# Patient Record
Sex: Male | Born: 1985 | Race: White | Hispanic: No | Marital: Single | Smoking: Current every day smoker
Health system: Southern US, Community
[De-identification: ages and names within clinical notes are randomized; demographics above are authoritative.]

## PROBLEM LIST (undated history)

## (undated) DIAGNOSIS — K859 Acute pancreatitis without necrosis or infection, unspecified: Secondary | ICD-10-CM

## (undated) DIAGNOSIS — M549 Dorsalgia, unspecified: Secondary | ICD-10-CM

## (undated) HISTORY — PX: TONSILLECTOMY: SUR1361

---

## 2004-10-31 ENCOUNTER — Emergency Department: Payer: Self-pay | Admitting: Emergency Medicine

## 2009-10-05 ENCOUNTER — Emergency Department: Payer: Self-pay | Admitting: Emergency Medicine

## 2009-12-25 ENCOUNTER — Emergency Department: Payer: Self-pay | Admitting: Emergency Medicine

## 2012-01-23 ENCOUNTER — Emergency Department: Payer: Self-pay | Admitting: Emergency Medicine

## 2012-03-20 ENCOUNTER — Emergency Department: Payer: Self-pay | Admitting: Emergency Medicine

## 2012-10-20 ENCOUNTER — Emergency Department: Payer: Self-pay | Admitting: Emergency Medicine

## 2012-10-21 ENCOUNTER — Emergency Department: Payer: Self-pay | Admitting: Emergency Medicine

## 2012-10-21 LAB — URINALYSIS, COMPLETE
Bacteria: NONE SEEN
Blood: NEGATIVE
Glucose,UR: NEGATIVE mg/dL (ref 0–75)
Leukocyte Esterase: NEGATIVE
Nitrite: NEGATIVE
Protein: NEGATIVE
Specific Gravity: 1.002 (ref 1.003–1.030)
Squamous Epithelial: <1
WBC UR: <1 /HPF (ref 0–5)

## 2012-12-09 ENCOUNTER — Emergency Department: Payer: Self-pay | Admitting: Emergency Medicine

## 2012-12-17 ENCOUNTER — Emergency Department: Payer: Self-pay | Admitting: Internal Medicine

## 2012-12-17 LAB — URINALYSIS, COMPLETE
Bacteria: NONE SEEN
Ketone: NEGATIVE
Nitrite: NEGATIVE
Ph: 5 (ref 4.5–8.0)
Protein: NEGATIVE
Specific Gravity: 1.02 (ref 1.003–1.030)
Squamous Epithelial: NONE SEEN

## 2013-02-25 ENCOUNTER — Emergency Department: Payer: Self-pay | Admitting: Emergency Medicine

## 2013-02-25 LAB — COMPREHENSIVE METABOLIC PANEL
Albumin: 4.1 g/dL (ref 3.4–5.0)
Alkaline Phosphatase: 95 U/L (ref 50–136)
Bilirubin,Total: 0.5 mg/dL (ref 0.2–1.0)
Chloride: 107 mmol/L (ref 98–107)
Co2: 26 mmol/L (ref 21–32)
Creatinine: 0.89 mg/dL (ref 0.60–1.30)
EGFR (African American): >60
EGFR (Non-African Amer.): >60
Glucose: 89 mg/dL (ref 65–99)
Osmolality: 273 (ref 275–301)
Potassium: 3.9 mmol/L (ref 3.5–5.1)
SGPT (ALT): 35 U/L (ref 12–78)
Total Protein: 7.2 g/dL (ref 6.4–8.2)

## 2013-02-25 LAB — CBC
MCH: 31.2 pg (ref 26.0–34.0)
MCHC: 33.9 g/dL (ref 32.0–36.0)
MCV: 92 fL (ref 80–100)
Platelet: 229 10*3/uL (ref 150–440)
RDW: 12.9 % (ref 11.5–14.5)
WBC: 13.2 10*3/uL — ABNORMAL HIGH (ref 3.8–10.6)

## 2013-02-25 LAB — TROPONIN I: Troponin-I: <0.02 ng/mL

## 2013-02-25 LAB — LIPASE, BLOOD: Lipase: 1209 U/L — ABNORMAL HIGH (ref 73–393)

## 2013-03-02 ENCOUNTER — Emergency Department: Payer: Self-pay | Admitting: Emergency Medicine

## 2013-03-02 LAB — LIPASE, BLOOD: Lipase: 352 U/L (ref 73–393)

## 2013-03-02 LAB — COMPREHENSIVE METABOLIC PANEL
Alkaline Phosphatase: 86 U/L (ref 50–136)
Bilirubin,Total: 0.5 mg/dL (ref 0.2–1.0)
Calcium, Total: 9.2 mg/dL (ref 8.5–10.1)
Creatinine: 1.04 mg/dL (ref 0.60–1.30)
EGFR (Non-African Amer.): >60
Glucose: 113 mg/dL — ABNORMAL HIGH (ref 65–99)
SGOT(AST): 16 U/L (ref 15–37)
SGPT (ALT): 26 U/L (ref 12–78)
Total Protein: 7.4 g/dL (ref 6.4–8.2)

## 2013-03-02 LAB — CBC
MCH: 31.6 pg (ref 26.0–34.0)
MCV: 92 fL (ref 80–100)
RBC: 5.12 10*6/uL (ref 4.40–5.90)
WBC: 9.4 10*3/uL (ref 3.8–10.6)

## 2013-03-02 LAB — URINALYSIS, COMPLETE
Glucose,UR: NEGATIVE mg/dL (ref 0–75)
Ketone: NEGATIVE
Leukocyte Esterase: NEGATIVE
Ph: 5 (ref 4.5–8.0)
Protein: NEGATIVE
RBC,UR: 1 /HPF (ref 0–5)

## 2013-04-10 ENCOUNTER — Emergency Department: Payer: Self-pay | Admitting: Emergency Medicine

## 2013-04-11 ENCOUNTER — Emergency Department: Payer: Self-pay | Admitting: Emergency Medicine

## 2013-06-03 ENCOUNTER — Emergency Department: Payer: Self-pay | Admitting: Emergency Medicine

## 2013-06-05 ENCOUNTER — Emergency Department: Payer: Self-pay | Admitting: Emergency Medicine

## 2013-06-05 LAB — URINALYSIS, COMPLETE
Bilirubin,UR: NEGATIVE
Blood: NEGATIVE
Glucose,UR: NEGATIVE mg/dL (ref 0–75)
Ketone: NEGATIVE
Leukocyte Esterase: NEGATIVE
Nitrite: NEGATIVE
Ph: 5 (ref 4.5–8.0)
Protein: NEGATIVE
RBC,UR: <1 /HPF (ref 0–5)
WBC UR: <1 /HPF (ref 0–5)

## 2013-06-10 ENCOUNTER — Emergency Department: Payer: Self-pay | Admitting: Emergency Medicine

## 2013-06-10 LAB — CBC WITH DIFFERENTIAL/PLATELET
Basophil %: 1 %
Eosinophil %: 3.8 %
HCT: 45.4 % (ref 40.0–52.0)
HGB: 15.9 g/dL (ref 13.0–18.0)
MCH: 32.3 pg (ref 26.0–34.0)
MCHC: 35 g/dL (ref 32.0–36.0)
MCV: 92 fL (ref 80–100)
Monocyte %: 4.2 %
Platelet: 220 10*3/uL (ref 150–440)
RBC: 4.92 10*6/uL (ref 4.40–5.90)
RDW: 12.6 % (ref 11.5–14.5)
WBC: 14 10*3/uL — ABNORMAL HIGH (ref 3.8–10.6)

## 2013-06-10 LAB — COMPREHENSIVE METABOLIC PANEL
Albumin: 4.1 g/dL (ref 3.4–5.0)
Alkaline Phosphatase: 93 U/L (ref 50–136)
Anion Gap: 6 — ABNORMAL LOW (ref 7–16)
Calcium, Total: 9.2 mg/dL (ref 8.5–10.1)
Chloride: 108 mmol/L — ABNORMAL HIGH (ref 98–107)
Creatinine: 1.02 mg/dL (ref 0.60–1.30)
EGFR (African American): >60
Glucose: 84 mg/dL (ref 65–99)
Osmolality: 275 (ref 275–301)
Potassium: 3.9 mmol/L (ref 3.5–5.1)
SGOT(AST): 33 U/L (ref 15–37)
SGPT (ALT): 61 U/L (ref 12–78)
Sodium: 139 mmol/L (ref 136–145)

## 2013-06-10 LAB — TROPONIN I: Troponin-I: <0.02 ng/mL

## 2013-06-30 ENCOUNTER — Emergency Department: Payer: Self-pay | Admitting: Emergency Medicine

## 2013-09-10 ENCOUNTER — Emergency Department: Payer: Self-pay | Admitting: Emergency Medicine

## 2013-09-10 LAB — CBC WITH DIFFERENTIAL/PLATELET
Basophil #: 0.1 10*3/uL (ref 0.0–0.1)
Basophil %: 0.9 %
Eosinophil #: 0.5 10*3/uL (ref 0.0–0.7)
Eosinophil %: 4.5 %
HCT: 47.2 % (ref 40.0–52.0)
HGB: 16.1 g/dL (ref 13.0–18.0)
LYMPHS PCT: 23 %
Lymphocyte #: 2.4 10*3/uL (ref 1.0–3.6)
MCH: 32.2 pg (ref 26.0–34.0)
MCHC: 34 g/dL (ref 32.0–36.0)
MCV: 95 fL (ref 80–100)
Monocyte #: 0.6 x10 3/mm (ref 0.2–1.0)
Monocyte %: 5.6 %
NEUTROS ABS: 6.8 10*3/uL — AB (ref 1.4–6.5)
Neutrophil %: 66 %
Platelet: 208 10*3/uL (ref 150–440)
RBC: 4.99 10*6/uL (ref 4.40–5.90)
RDW: 12.6 % (ref 11.5–14.5)
WBC: 10.3 10*3/uL (ref 3.8–10.6)

## 2013-09-10 LAB — URINALYSIS, COMPLETE
Bacteria: NONE SEEN
Bilirubin,UR: NEGATIVE
Blood: NEGATIVE
Glucose,UR: NEGATIVE mg/dL (ref 0–75)
Ketone: NEGATIVE
Leukocyte Esterase: NEGATIVE
NITRITE: NEGATIVE
PROTEIN: NEGATIVE
Ph: 5 (ref 4.5–8.0)
Specific Gravity: 1.012 (ref 1.003–1.030)
Squamous Epithelial: NONE SEEN

## 2013-09-10 LAB — COMPREHENSIVE METABOLIC PANEL
ALBUMIN: 4.1 g/dL (ref 3.4–5.0)
Alkaline Phosphatase: 102 U/L
Anion Gap: 3 — ABNORMAL LOW (ref 7–16)
BILIRUBIN TOTAL: 0.4 mg/dL (ref 0.2–1.0)
BUN: 9 mg/dL (ref 7–18)
CALCIUM: 9.1 mg/dL (ref 8.5–10.1)
CREATININE: 0.88 mg/dL (ref 0.60–1.30)
Chloride: 106 mmol/L (ref 98–107)
Co2: 26 mmol/L (ref 21–32)
EGFR (Non-African Amer.): >60
GLUCOSE: 94 mg/dL (ref 65–99)
OSMOLALITY: 269 (ref 275–301)
Potassium: 4 mmol/L (ref 3.5–5.1)
SGOT(AST): 25 U/L (ref 15–37)
SGPT (ALT): 71 U/L (ref 12–78)
SODIUM: 135 mmol/L — AB (ref 136–145)
Total Protein: 7.4 g/dL (ref 6.4–8.2)

## 2013-09-10 LAB — LIPASE, BLOOD: LIPASE: 90 U/L (ref 73–393)

## 2013-10-09 ENCOUNTER — Emergency Department: Payer: Self-pay | Admitting: Emergency Medicine

## 2013-10-09 LAB — URINALYSIS, COMPLETE
Bacteria: NONE SEEN
Bilirubin,UR: NEGATIVE
Blood: NEGATIVE
GLUCOSE, UR: NEGATIVE mg/dL (ref 0–75)
KETONE: NEGATIVE
Leukocyte Esterase: NEGATIVE
Nitrite: NEGATIVE
PH: 6 (ref 4.5–8.0)
PROTEIN: NEGATIVE
RBC, UR: NONE SEEN /HPF (ref 0–5)
Specific Gravity: 1.012 (ref 1.003–1.030)
Squamous Epithelial: NONE SEEN

## 2013-10-09 LAB — COMPREHENSIVE METABOLIC PANEL
ALK PHOS: 84 U/L
ALT: 21 U/L (ref 12–78)
Albumin: 4.2 g/dL (ref 3.4–5.0)
Anion Gap: 5 — ABNORMAL LOW (ref 7–16)
BUN: 9 mg/dL (ref 7–18)
Bilirubin,Total: 0.5 mg/dL (ref 0.2–1.0)
CHLORIDE: 106 mmol/L (ref 98–107)
CO2: 26 mmol/L (ref 21–32)
Calcium, Total: 8.9 mg/dL (ref 8.5–10.1)
Creatinine: 0.81 mg/dL (ref 0.60–1.30)
EGFR (African American): >60
EGFR (Non-African Amer.): >60
GLUCOSE: 95 mg/dL (ref 65–99)
OSMOLALITY: 272 (ref 275–301)
Potassium: 4 mmol/L (ref 3.5–5.1)
SGOT(AST): 19 U/L (ref 15–37)
SODIUM: 137 mmol/L (ref 136–145)
Total Protein: 7.7 g/dL (ref 6.4–8.2)

## 2013-10-09 LAB — CBC WITH DIFFERENTIAL/PLATELET
BASOS PCT: 0.9 %
Basophil #: 0.1 10*3/uL (ref 0.0–0.1)
Eosinophil #: 0.6 10*3/uL (ref 0.0–0.7)
Eosinophil %: 5.6 %
HCT: 46.5 % (ref 40.0–52.0)
HGB: 16.1 g/dL (ref 13.0–18.0)
LYMPHS PCT: 26.3 %
Lymphocyte #: 2.7 10*3/uL (ref 1.0–3.6)
MCH: 32.6 pg (ref 26.0–34.0)
MCHC: 34.6 g/dL (ref 32.0–36.0)
MCV: 94 fL (ref 80–100)
Monocyte #: 0.6 x10 3/mm (ref 0.2–1.0)
Monocyte %: 5.7 %
NEUTROS ABS: 6.3 10*3/uL (ref 1.4–6.5)
NEUTROS PCT: 61.5 %
Platelet: 208 10*3/uL (ref 150–440)
RBC: 4.94 10*6/uL (ref 4.40–5.90)
RDW: 12.6 % (ref 11.5–14.5)
WBC: 10.2 10*3/uL (ref 3.8–10.6)

## 2013-10-09 LAB — LIPASE, BLOOD: Lipase: 307 U/L (ref 73–393)

## 2013-11-07 ENCOUNTER — Emergency Department: Payer: Self-pay | Admitting: Emergency Medicine

## 2013-11-11 ENCOUNTER — Emergency Department: Payer: Self-pay | Admitting: Internal Medicine

## 2014-01-17 ENCOUNTER — Emergency Department: Payer: Self-pay | Admitting: Emergency Medicine

## 2014-02-25 ENCOUNTER — Emergency Department: Payer: Self-pay | Admitting: Emergency Medicine

## 2014-04-01 ENCOUNTER — Emergency Department: Payer: Self-pay | Admitting: Emergency Medicine

## 2014-04-01 LAB — URINALYSIS, COMPLETE
Bilirubin,UR: NEGATIVE
Blood: NEGATIVE
Glucose,UR: NEGATIVE mg/dL (ref 0–75)
KETONE: NEGATIVE
Leukocyte Esterase: NEGATIVE
Nitrite: NEGATIVE
PH: 5 (ref 4.5–8.0)
PROTEIN: NEGATIVE
RBC,UR: 2 /HPF (ref 0–5)
SPECIFIC GRAVITY: 1.031 (ref 1.003–1.030)
Squamous Epithelial: NONE SEEN
WBC UR: <1 /HPF (ref 0–5)

## 2014-04-12 ENCOUNTER — Emergency Department: Payer: Self-pay | Admitting: Emergency Medicine

## 2014-04-12 LAB — COMPREHENSIVE METABOLIC PANEL
ALK PHOS: 87 U/L
AST: 11 U/L — AB (ref 15–37)
Albumin: 4 g/dL (ref 3.4–5.0)
Anion Gap: 6 — ABNORMAL LOW (ref 7–16)
BUN: 11 mg/dL (ref 7–18)
Bilirubin,Total: 0.4 mg/dL (ref 0.2–1.0)
CALCIUM: 9.1 mg/dL (ref 8.5–10.1)
CREATININE: 0.98 mg/dL (ref 0.60–1.30)
Chloride: 107 mmol/L (ref 98–107)
Co2: 27 mmol/L (ref 21–32)
EGFR (African American): >60
GLUCOSE: 96 mg/dL (ref 65–99)
OSMOLALITY: 279 (ref 275–301)
Potassium: 4.4 mmol/L (ref 3.5–5.1)
SGPT (ALT): 23 U/L
SODIUM: 140 mmol/L (ref 136–145)
Total Protein: 7.1 g/dL (ref 6.4–8.2)

## 2014-04-12 LAB — CBC WITH DIFFERENTIAL/PLATELET
BASOS PCT: 1.2 %
Basophil #: 0.1 10*3/uL (ref 0.0–0.1)
EOS ABS: 0.4 10*3/uL (ref 0.0–0.7)
EOS PCT: 4.5 %
HCT: 46.3 % (ref 40.0–52.0)
HGB: 15.6 g/dL (ref 13.0–18.0)
Lymphocyte #: 2.5 10*3/uL (ref 1.0–3.6)
Lymphocyte %: 28.1 %
MCH: 32 pg (ref 26.0–34.0)
MCHC: 33.7 g/dL (ref 32.0–36.0)
MCV: 95 fL (ref 80–100)
Monocyte #: 0.6 x10 3/mm (ref 0.2–1.0)
Monocyte %: 6.2 %
NEUTROS PCT: 60 %
Neutrophil #: 5.4 10*3/uL (ref 1.4–6.5)
PLATELETS: 221 10*3/uL (ref 150–440)
RBC: 4.86 10*6/uL (ref 4.40–5.90)
RDW: 12.8 % (ref 11.5–14.5)
WBC: 9 10*3/uL (ref 3.8–10.6)

## 2014-04-12 LAB — URINALYSIS, COMPLETE
BACTERIA: NONE SEEN
Bilirubin,UR: NEGATIVE
Blood: NEGATIVE
GLUCOSE, UR: NEGATIVE mg/dL (ref 0–75)
KETONE: NEGATIVE
LEUKOCYTE ESTERASE: NEGATIVE
Nitrite: NEGATIVE
Ph: 5 (ref 4.5–8.0)
Protein: NEGATIVE
RBC,UR: <1 /HPF (ref 0–5)
SPECIFIC GRAVITY: 1.028 (ref 1.003–1.030)
WBC UR: 1 /HPF (ref 0–5)

## 2014-05-16 ENCOUNTER — Emergency Department: Payer: Self-pay | Admitting: Student

## 2014-05-16 LAB — COMPREHENSIVE METABOLIC PANEL
AST: 23 U/L (ref 15–37)
Albumin: 3.8 g/dL (ref 3.4–5.0)
Alkaline Phosphatase: 81 U/L
Anion Gap: 6 — ABNORMAL LOW (ref 7–16)
BUN: 11 mg/dL (ref 7–18)
Bilirubin,Total: 0.3 mg/dL (ref 0.2–1.0)
CALCIUM: 8.6 mg/dL (ref 8.5–10.1)
CHLORIDE: 108 mmol/L — AB (ref 98–107)
Co2: 26 mmol/L (ref 21–32)
Creatinine: 0.91 mg/dL (ref 0.60–1.30)
EGFR (Non-African Amer.): >60
Glucose: 106 mg/dL — ABNORMAL HIGH (ref 65–99)
Osmolality: 279 (ref 275–301)
Potassium: 3.8 mmol/L (ref 3.5–5.1)
SGPT (ALT): 30 U/L
SODIUM: 140 mmol/L (ref 136–145)
Total Protein: 7.3 g/dL (ref 6.4–8.2)

## 2014-05-16 LAB — URINALYSIS, COMPLETE
Bacteria: NONE SEEN
Bilirubin,UR: NEGATIVE
GLUCOSE, UR: NEGATIVE mg/dL (ref 0–75)
Ketone: NEGATIVE
Leukocyte Esterase: NEGATIVE
NITRITE: NEGATIVE
Ph: 5 (ref 4.5–8.0)
Protein: NEGATIVE
Specific Gravity: 1.014 (ref 1.003–1.030)
Squamous Epithelial: <1
WBC UR: <1 /HPF (ref 0–5)

## 2014-05-16 LAB — CBC
HCT: 45.2 % (ref 40.0–52.0)
HGB: 14.6 g/dL (ref 13.0–18.0)
MCH: 30.8 pg (ref 26.0–34.0)
MCHC: 32.4 g/dL (ref 32.0–36.0)
MCV: 95 fL (ref 80–100)
PLATELETS: 264 10*3/uL (ref 150–440)
RBC: 4.75 10*6/uL (ref 4.40–5.90)
RDW: 12.6 % (ref 11.5–14.5)
WBC: 9.5 10*3/uL (ref 3.8–10.6)

## 2014-05-16 LAB — LIPASE, BLOOD: Lipase: 941 U/L — ABNORMAL HIGH (ref 73–393)

## 2014-05-19 ENCOUNTER — Emergency Department: Payer: Self-pay | Admitting: Emergency Medicine

## 2014-05-19 LAB — CBC
HCT: 43.5 % (ref 40.0–52.0)
HGB: 14.7 g/dL (ref 13.0–18.0)
MCH: 31.8 pg (ref 26.0–34.0)
MCHC: 33.9 g/dL (ref 32.0–36.0)
MCV: 94 fL (ref 80–100)
PLATELETS: 266 10*3/uL (ref 150–440)
RBC: 4.64 10*6/uL (ref 4.40–5.90)
RDW: 12.4 % (ref 11.5–14.5)
WBC: 11.1 10*3/uL — ABNORMAL HIGH (ref 3.8–10.6)

## 2014-05-19 LAB — COMPREHENSIVE METABOLIC PANEL
ALK PHOS: 80 U/L
ANION GAP: 5 — AB (ref 7–16)
AST: 25 U/L (ref 15–37)
Albumin: 3.8 g/dL (ref 3.4–5.0)
BUN: 12 mg/dL (ref 7–18)
Bilirubin,Total: 0.5 mg/dL (ref 0.2–1.0)
CALCIUM: 8.9 mg/dL (ref 8.5–10.1)
CO2: 28 mmol/L (ref 21–32)
CREATININE: 1 mg/dL (ref 0.60–1.30)
Chloride: 108 mmol/L — ABNORMAL HIGH (ref 98–107)
EGFR (African American): >60
Glucose: 78 mg/dL (ref 65–99)
OSMOLALITY: 280 (ref 275–301)
Potassium: 3.9 mmol/L (ref 3.5–5.1)
SGPT (ALT): 34 U/L
SODIUM: 141 mmol/L (ref 136–145)
TOTAL PROTEIN: 7 g/dL (ref 6.4–8.2)

## 2014-05-19 LAB — LIPASE, BLOOD: Lipase: 190 U/L (ref 73–393)

## 2014-05-19 LAB — ETHANOL

## 2014-06-02 ENCOUNTER — Emergency Department: Payer: Self-pay | Admitting: Emergency Medicine

## 2014-06-07 ENCOUNTER — Emergency Department: Payer: Self-pay | Admitting: Emergency Medicine

## 2014-06-20 ENCOUNTER — Emergency Department: Payer: Self-pay | Admitting: Emergency Medicine

## 2014-06-20 LAB — URINALYSIS, COMPLETE
BACTERIA: NONE SEEN
Bilirubin,UR: NEGATIVE
Blood: NEGATIVE
Glucose,UR: NEGATIVE mg/dL (ref 0–75)
KETONE: NEGATIVE
LEUKOCYTE ESTERASE: NEGATIVE
Nitrite: NEGATIVE
PROTEIN: NEGATIVE
Ph: 5 (ref 4.5–8.0)
RBC, UR: NONE SEEN /HPF (ref 0–5)
Specific Gravity: 1.025 (ref 1.003–1.030)
Squamous Epithelial: NONE SEEN
WBC UR: NONE SEEN /HPF (ref 0–5)

## 2014-06-20 LAB — COMPREHENSIVE METABOLIC PANEL
ALT: 70 U/L — AB
AST: 30 U/L (ref 15–37)
Albumin: 3.9 g/dL (ref 3.4–5.0)
Alkaline Phosphatase: 97 U/L
Anion Gap: 6 — ABNORMAL LOW (ref 7–16)
BUN: 12 mg/dL (ref 7–18)
Bilirubin,Total: 0.4 mg/dL (ref 0.2–1.0)
CALCIUM: 8.6 mg/dL (ref 8.5–10.1)
Chloride: 108 mmol/L — ABNORMAL HIGH (ref 98–107)
Co2: 27 mmol/L (ref 21–32)
Creatinine: 1.05 mg/dL (ref 0.60–1.30)
GLUCOSE: 102 mg/dL — AB (ref 65–99)
OSMOLALITY: 281 (ref 275–301)
POTASSIUM: 3.7 mmol/L (ref 3.5–5.1)
Sodium: 141 mmol/L (ref 136–145)
Total Protein: 6.8 g/dL (ref 6.4–8.2)

## 2014-06-20 LAB — CBC WITH DIFFERENTIAL/PLATELET
Basophil #: 0.1 10*3/uL (ref 0.0–0.1)
Basophil %: 1 %
EOS ABS: 0.6 10*3/uL (ref 0.0–0.7)
Eosinophil %: 5.4 %
HCT: 46.5 % (ref 40.0–52.0)
HGB: 15.6 g/dL (ref 13.0–18.0)
LYMPHS ABS: 3 10*3/uL (ref 1.0–3.6)
Lymphocyte %: 27.9 %
MCH: 31.9 pg (ref 26.0–34.0)
MCHC: 33.5 g/dL (ref 32.0–36.0)
MCV: 95 fL (ref 80–100)
MONOS PCT: 5.4 %
Monocyte #: 0.6 x10 3/mm (ref 0.2–1.0)
NEUTROS PCT: 60.3 %
Neutrophil #: 6.5 10*3/uL (ref 1.4–6.5)
Platelet: 234 10*3/uL (ref 150–440)
RBC: 4.88 10*6/uL (ref 4.40–5.90)
RDW: 12.5 % (ref 11.5–14.5)
WBC: 10.8 10*3/uL — ABNORMAL HIGH (ref 3.8–10.6)

## 2014-06-20 LAB — LIPASE, BLOOD: LIPASE: 863 U/L — AB (ref 73–393)

## 2014-06-20 LAB — TROPONIN I: Troponin-I: <0.02 ng/mL

## 2014-06-21 ENCOUNTER — Inpatient Hospital Stay: Payer: Self-pay | Admitting: Internal Medicine

## 2014-06-21 LAB — CBC WITH DIFFERENTIAL/PLATELET
BASOS ABS: 0.1 10*3/uL (ref 0.0–0.1)
BASOS PCT: 1 %
EOS ABS: 0.3 10*3/uL (ref 0.0–0.7)
Eosinophil %: 3.3 %
HCT: 45.8 % (ref 40.0–52.0)
HGB: 15.2 g/dL (ref 13.0–18.0)
Lymphocyte #: 3 10*3/uL (ref 1.0–3.6)
Lymphocyte %: 30.5 %
MCH: 32 pg (ref 26.0–34.0)
MCHC: 33.3 g/dL (ref 32.0–36.0)
MCV: 96 fL (ref 80–100)
Monocyte #: 0.6 x10 3/mm (ref 0.2–1.0)
Monocyte %: 6.3 %
NEUTROS PCT: 58.9 %
Neutrophil #: 5.8 10*3/uL (ref 1.4–6.5)
Platelet: 221 10*3/uL (ref 150–440)
RBC: 4.76 10*6/uL (ref 4.40–5.90)
RDW: 12.5 % (ref 11.5–14.5)
WBC: 9.8 10*3/uL (ref 3.8–10.6)

## 2014-06-21 LAB — URINALYSIS, COMPLETE
Bacteria: NONE SEEN
Bilirubin,UR: NEGATIVE
Blood: NEGATIVE
Glucose,UR: NEGATIVE mg/dL (ref 0–75)
Ketone: NEGATIVE
LEUKOCYTE ESTERASE: NEGATIVE
NITRITE: NEGATIVE
PH: 5 (ref 4.5–8.0)
Protein: NEGATIVE
SPECIFIC GRAVITY: 1.028 (ref 1.003–1.030)
Squamous Epithelial: NONE SEEN

## 2014-06-21 LAB — LIPID PANEL
CHOLESTEROL: 158 mg/dL (ref 0–200)
HDL: 44 mg/dL (ref 40–60)
LDL CHOLESTEROL, CALC: 86 mg/dL (ref 0–100)
Triglycerides: 139 mg/dL (ref 0–200)
VLDL CHOLESTEROL, CALC: 28 mg/dL (ref 5–40)

## 2014-06-21 LAB — COMPREHENSIVE METABOLIC PANEL
ALT: 60 U/L
Albumin: 4 g/dL (ref 3.4–5.0)
Alkaline Phosphatase: 91 U/L
Anion Gap: 8 (ref 7–16)
BUN: 12 mg/dL (ref 7–18)
Bilirubin,Total: 0.5 mg/dL (ref 0.2–1.0)
CALCIUM: 8.8 mg/dL (ref 8.5–10.1)
CO2: 27 mmol/L (ref 21–32)
CREATININE: 1.06 mg/dL (ref 0.60–1.30)
Chloride: 107 mmol/L (ref 98–107)
GLUCOSE: 106 mg/dL — AB (ref 65–99)
OSMOLALITY: 283 (ref 275–301)
Potassium: 4.7 mmol/L (ref 3.5–5.1)
SGOT(AST): 19 U/L (ref 15–37)
Sodium: 142 mmol/L (ref 136–145)
Total Protein: 7.1 g/dL (ref 6.4–8.2)

## 2014-06-21 LAB — LIPASE, BLOOD: Lipase: 1142 U/L — ABNORMAL HIGH (ref 73–393)

## 2014-06-22 LAB — COMPREHENSIVE METABOLIC PANEL
ALBUMIN: 3.1 g/dL — AB (ref 3.4–5.0)
ANION GAP: 4 — AB (ref 7–16)
AST: 24 U/L (ref 15–37)
Alkaline Phosphatase: 72 U/L
BUN: 12 mg/dL (ref 7–18)
Bilirubin,Total: 0.5 mg/dL (ref 0.2–1.0)
CALCIUM: 7.9 mg/dL — AB (ref 8.5–10.1)
CREATININE: 1.01 mg/dL (ref 0.60–1.30)
Chloride: 110 mmol/L — ABNORMAL HIGH (ref 98–107)
Co2: 26 mmol/L (ref 21–32)
Glucose: 91 mg/dL (ref 65–99)
Osmolality: 279 (ref 275–301)
POTASSIUM: 3.8 mmol/L (ref 3.5–5.1)
SGPT (ALT): 53 U/L
Sodium: 140 mmol/L (ref 136–145)
TOTAL PROTEIN: 5.5 g/dL — AB (ref 6.4–8.2)

## 2014-06-22 LAB — CBC WITH DIFFERENTIAL/PLATELET
BASOS ABS: 0.1 10*3/uL (ref 0.0–0.1)
Basophil %: 1 %
EOS ABS: 0.3 10*3/uL (ref 0.0–0.7)
Eosinophil %: 4.3 %
HCT: 39.4 % — AB (ref 40.0–52.0)
HGB: 13.1 g/dL (ref 13.0–18.0)
LYMPHS PCT: 41.1 %
Lymphocyte #: 3.1 10*3/uL (ref 1.0–3.6)
MCH: 32.3 pg (ref 26.0–34.0)
MCHC: 33.3 g/dL (ref 32.0–36.0)
MCV: 97 fL (ref 80–100)
MONO ABS: 0.5 x10 3/mm (ref 0.2–1.0)
Monocyte %: 6.9 %
Neutrophil #: 3.5 10*3/uL (ref 1.4–6.5)
Neutrophil %: 46.7 %
Platelet: 161 10*3/uL (ref 150–440)
RBC: 4.07 10*6/uL — ABNORMAL LOW (ref 4.40–5.90)
RDW: 12.4 % (ref 11.5–14.5)
WBC: 7.6 10*3/uL (ref 3.8–10.6)

## 2014-06-22 LAB — LIPID PANEL
Cholesterol: 124 mg/dL (ref 0–200)
HDL Cholesterol: 32 mg/dL — ABNORMAL LOW (ref 40–60)
Ldl Cholesterol, Calc: 70 mg/dL (ref 0–100)
Triglycerides: 109 mg/dL (ref 0–200)
VLDL Cholesterol, Calc: 22 mg/dL (ref 5–40)

## 2014-06-22 LAB — LIPASE, BLOOD: Lipase: 101 U/L (ref 73–393)

## 2014-06-23 LAB — LIPASE, BLOOD: LIPASE: 58 U/L — AB (ref 73–393)

## 2014-06-24 ENCOUNTER — Emergency Department: Payer: Self-pay | Admitting: Emergency Medicine

## 2014-06-24 LAB — URINALYSIS, COMPLETE
BACTERIA: NONE SEEN
Bilirubin,UR: NEGATIVE
Blood: NEGATIVE
GLUCOSE, UR: NEGATIVE mg/dL (ref 0–75)
Ketone: NEGATIVE
LEUKOCYTE ESTERASE: NEGATIVE
Nitrite: NEGATIVE
Ph: 5 (ref 4.5–8.0)
Protein: NEGATIVE
RBC,UR: NONE SEEN /HPF (ref 0–5)
Specific Gravity: 1.018 (ref 1.003–1.030)
Squamous Epithelial: NONE SEEN

## 2014-06-24 LAB — COMPREHENSIVE METABOLIC PANEL
ALBUMIN: 4.2 g/dL (ref 3.4–5.0)
ANION GAP: 7 (ref 7–16)
AST: 24 U/L (ref 15–37)
Alkaline Phosphatase: 99 U/L
BUN: 11 mg/dL (ref 7–18)
Bilirubin,Total: 0.4 mg/dL (ref 0.2–1.0)
CALCIUM: 9.1 mg/dL (ref 8.5–10.1)
Chloride: 107 mmol/L (ref 98–107)
Co2: 28 mmol/L (ref 21–32)
Creatinine: 1.06 mg/dL (ref 0.60–1.30)
EGFR (African American): >60
EGFR (Non-African Amer.): >60
GLUCOSE: 82 mg/dL (ref 65–99)
OSMOLALITY: 282 (ref 275–301)
Potassium: 4.1 mmol/L (ref 3.5–5.1)
SGPT (ALT): 52 U/L
SODIUM: 142 mmol/L (ref 136–145)
TOTAL PROTEIN: 7.6 g/dL (ref 6.4–8.2)

## 2014-06-24 LAB — LIPASE, BLOOD: LIPASE: 359 U/L (ref 73–393)

## 2014-06-24 LAB — CBC WITH DIFFERENTIAL/PLATELET
BASOS ABS: 0.1 10*3/uL (ref 0.0–0.1)
Basophil %: 0.9 %
EOS ABS: 0.3 10*3/uL (ref 0.0–0.7)
Eosinophil %: 2.8 %
HCT: 46.8 % (ref 40.0–52.0)
HGB: 16.1 g/dL (ref 13.0–18.0)
LYMPHS PCT: 21.7 %
Lymphocyte #: 2.3 10*3/uL (ref 1.0–3.6)
MCH: 32.4 pg (ref 26.0–34.0)
MCHC: 34.4 g/dL (ref 32.0–36.0)
MCV: 94 fL (ref 80–100)
Monocyte #: 0.6 x10 3/mm (ref 0.2–1.0)
Monocyte %: 5.4 %
NEUTROS PCT: 69.2 %
Neutrophil #: 7.4 10*3/uL — ABNORMAL HIGH (ref 1.4–6.5)
PLATELETS: 232 10*3/uL (ref 150–440)
RBC: 4.97 10*6/uL (ref 4.40–5.90)
RDW: 12.5 % (ref 11.5–14.5)
WBC: 10.6 10*3/uL (ref 3.8–10.6)

## 2014-07-01 ENCOUNTER — Emergency Department: Payer: Self-pay | Admitting: Emergency Medicine

## 2014-07-01 LAB — CBC
HCT: 46.3 % (ref 40.0–52.0)
HGB: 15.7 g/dL (ref 13.0–18.0)
MCH: 32.2 pg (ref 26.0–34.0)
MCHC: 33.8 g/dL (ref 32.0–36.0)
MCV: 95 fL (ref 80–100)
Platelet: 260 10*3/uL (ref 150–440)
RBC: 4.86 10*6/uL (ref 4.40–5.90)
RDW: 12.9 % (ref 11.5–14.5)
WBC: 11.9 10*3/uL — AB (ref 3.8–10.6)

## 2014-07-01 LAB — URINALYSIS, COMPLETE
BACTERIA: NONE SEEN
BILIRUBIN, UR: NEGATIVE
GLUCOSE, UR: NEGATIVE mg/dL (ref 0–75)
Ketone: NEGATIVE
LEUKOCYTE ESTERASE: NEGATIVE
Nitrite: NEGATIVE
PROTEIN: NEGATIVE
Ph: 5 (ref 4.5–8.0)
SPECIFIC GRAVITY: 1.02 (ref 1.003–1.030)
Squamous Epithelial: <1
WBC UR: 2 /HPF (ref 0–5)

## 2014-07-01 LAB — COMPREHENSIVE METABOLIC PANEL
AST: 14 U/L — AB (ref 15–37)
Albumin: 4.1 g/dL (ref 3.4–5.0)
Alkaline Phosphatase: 91 U/L
Anion Gap: 7 (ref 7–16)
BILIRUBIN TOTAL: 0.3 mg/dL (ref 0.2–1.0)
BUN: 14 mg/dL (ref 7–18)
CREATININE: 1.26 mg/dL (ref 0.60–1.30)
Calcium, Total: 9 mg/dL (ref 8.5–10.1)
Chloride: 107 mmol/L (ref 98–107)
Co2: 26 mmol/L (ref 21–32)
EGFR (Non-African Amer.): >60
Glucose: 92 mg/dL (ref 65–99)
Osmolality: 280 (ref 275–301)
Potassium: 4.2 mmol/L (ref 3.5–5.1)
SGPT (ALT): 27 U/L
Sodium: 140 mmol/L (ref 136–145)
Total Protein: 7.5 g/dL (ref 6.4–8.2)

## 2014-07-05 ENCOUNTER — Emergency Department: Payer: Self-pay | Admitting: Emergency Medicine

## 2014-07-05 LAB — URINALYSIS, COMPLETE
Bacteria: NONE SEEN
Bilirubin,UR: NEGATIVE
Glucose,UR: NEGATIVE mg/dL (ref 0–75)
KETONE: NEGATIVE
Leukocyte Esterase: NEGATIVE
NITRITE: NEGATIVE
PH: 6 (ref 4.5–8.0)
Protein: NEGATIVE
RBC,UR: 919 /HPF (ref 0–5)
SPECIFIC GRAVITY: 1.015 (ref 1.003–1.030)

## 2014-07-18 ENCOUNTER — Emergency Department: Payer: Self-pay | Admitting: Emergency Medicine

## 2014-07-18 LAB — BASIC METABOLIC PANEL
Anion Gap: 5 — ABNORMAL LOW (ref 7–16)
BUN: 16 mg/dL (ref 7–18)
CALCIUM: 9.1 mg/dL (ref 8.5–10.1)
CHLORIDE: 103 mmol/L (ref 98–107)
Co2: 29 mmol/L (ref 21–32)
Creatinine: 0.92 mg/dL (ref 0.60–1.30)
EGFR (Non-African Amer.): >60
GLUCOSE: 115 mg/dL — AB (ref 65–99)
Osmolality: 276 (ref 275–301)
POTASSIUM: 4.3 mmol/L (ref 3.5–5.1)
Sodium: 137 mmol/L (ref 136–145)

## 2014-07-18 LAB — CBC
HCT: 48.8 % (ref 40.0–52.0)
HGB: 16 g/dL (ref 13.0–18.0)
MCH: 31.6 pg (ref 26.0–34.0)
MCHC: 32.9 g/dL (ref 32.0–36.0)
MCV: 96 fL (ref 80–100)
PLATELETS: 262 10*3/uL (ref 150–440)
RBC: 5.07 10*6/uL (ref 4.40–5.90)
RDW: 13.3 % (ref 11.5–14.5)
WBC: 12.1 10*3/uL — ABNORMAL HIGH (ref 3.8–10.6)

## 2014-07-18 LAB — TROPONIN I: Troponin-I: <0.02 ng/mL

## 2014-07-18 LAB — HEPATIC FUNCTION PANEL A (ARMC)
ALK PHOS: 100 U/L
ALT: 65 U/L — AB
AST: 25 U/L (ref 15–37)
Albumin: 4.1 g/dL (ref 3.4–5.0)
Bilirubin, Direct: <0.1 mg/dL (ref 0.0–0.2)
Bilirubin,Total: 0.4 mg/dL (ref 0.2–1.0)
Total Protein: 7.4 g/dL (ref 6.4–8.2)

## 2014-07-18 LAB — LIPASE, BLOOD: Lipase: 171 U/L (ref 73–393)

## 2014-08-26 ENCOUNTER — Emergency Department: Payer: Self-pay | Admitting: Emergency Medicine

## 2014-08-26 LAB — URINALYSIS, COMPLETE
Bacteria: NONE SEEN
Bilirubin,UR: NEGATIVE
Blood: NEGATIVE
Glucose,UR: NEGATIVE mg/dL (ref 0–75)
KETONE: NEGATIVE
Leukocyte Esterase: NEGATIVE
Nitrite: NEGATIVE
Ph: 7 (ref 4.5–8.0)
Protein: NEGATIVE
RBC,UR: <1 /HPF (ref 0–5)
SPECIFIC GRAVITY: 1.019 (ref 1.003–1.030)
Squamous Epithelial: <1

## 2014-08-26 LAB — CBC WITH DIFFERENTIAL/PLATELET
BASOS PCT: 0.9 %
Basophil #: 0.1 10*3/uL (ref 0.0–0.1)
Eosinophil #: 0.4 10*3/uL (ref 0.0–0.7)
Eosinophil %: 4.7 %
HCT: 46 % (ref 40.0–52.0)
HGB: 15.7 g/dL (ref 13.0–18.0)
Lymphocyte #: 3.2 10*3/uL (ref 1.0–3.6)
Lymphocyte %: 33.4 %
MCH: 32.2 pg (ref 26.0–34.0)
MCHC: 34.3 g/dL (ref 32.0–36.0)
MCV: 94 fL (ref 80–100)
MONOS PCT: 7.3 %
Monocyte #: 0.7 x10 3/mm (ref 0.2–1.0)
Neutrophil #: 5.1 10*3/uL (ref 1.4–6.5)
Neutrophil %: 53.7 %
PLATELETS: 227 10*3/uL (ref 150–440)
RBC: 4.88 10*6/uL (ref 4.40–5.90)
RDW: 12.5 % (ref 11.5–14.5)
WBC: 9.4 10*3/uL (ref 3.8–10.6)

## 2014-08-26 LAB — COMPREHENSIVE METABOLIC PANEL
ALT: 42 U/L
Albumin: 3.9 g/dL (ref 3.4–5.0)
Alkaline Phosphatase: 104 U/L
Anion Gap: 6 — ABNORMAL LOW (ref 7–16)
BILIRUBIN TOTAL: 0.4 mg/dL (ref 0.2–1.0)
BUN: 11 mg/dL (ref 7–18)
CHLORIDE: 107 mmol/L (ref 98–107)
CREATININE: 1.35 mg/dL — AB (ref 0.60–1.30)
Calcium, Total: 8.8 mg/dL (ref 8.5–10.1)
Co2: 27 mmol/L (ref 21–32)
EGFR (African American): >60
EGFR (Non-African Amer.): >60
Glucose: 93 mg/dL (ref 65–99)
OSMOLALITY: 278 (ref 275–301)
POTASSIUM: 4.3 mmol/L (ref 3.5–5.1)
SGOT(AST): 32 U/L (ref 15–37)
SODIUM: 140 mmol/L (ref 136–145)
Total Protein: 7.3 g/dL (ref 6.4–8.2)

## 2014-08-26 LAB — LIPASE, BLOOD: Lipase: 268 U/L (ref 73–393)

## 2014-09-05 ENCOUNTER — Emergency Department: Payer: Self-pay | Admitting: Physician Assistant

## 2014-09-05 LAB — URINALYSIS, COMPLETE
BILIRUBIN, UR: NEGATIVE
Bacteria: NONE SEEN
Blood: NEGATIVE
Glucose,UR: NEGATIVE mg/dL (ref 0–75)
Ketone: NEGATIVE
LEUKOCYTE ESTERASE: NEGATIVE
NITRITE: NEGATIVE
PH: 7 (ref 4.5–8.0)
Protein: NEGATIVE
RBC,UR: NONE SEEN /HPF (ref 0–5)
Specific Gravity: 1.023 (ref 1.003–1.030)
Squamous Epithelial: NONE SEEN
WBC UR: NONE SEEN /HPF (ref 0–5)

## 2014-12-11 NOTE — Discharge Summary (Signed)
PATIENT NAME:  Kyle Rasmussen, Kyle Rasmussen MR#:  [id number] DATE OF BIRTH:  01-07-1986  DATE OF ADMISSION:  06/21/2014 DATE OF DISCHARGE:  06/23/2014  DISCHARGE DIAGNOSIS: Acute pancreatitis, likely alcohol induced.   SECONDARY DIAGNOSES: 1.  Chronic low back pain.  2.  Diverticulitis.   CONSULTATIONS: Gastroenterology, Dr. Dow AdolphMatthew Rein.  PROCEDURES AND RADIOLOGY: Abdominal ultrasound on 2nd of November showed no gallstone. Normal ultrasound.   MAJOR LABORATORY PANEL: UA on admission was negative. Repeat UA on the 2nd of November and 5th of November were negative.   IgG was within normal limits.   HISTORY AND SHORT HOSPITAL COURSE: The patient is a 29 year old male with no significant medical problems who was admitted for acute pancreatitis, likely alcohol induced. Please see Dr. Larose HiresVachhani's dictated history and physical for further details. GI consultation was obtained with Dr. Dow AdolphMatthew Rein who agreed with conservative management. The patient was slowly improving with treatment of pancreatitis and was able to tolerate diet, pain was much better controlled and was discharged home on 4th of November in stable condition.   PERTINENT DISCHARGE PHYSICAL EXAMINATION: VITAL SIGNS: On the day of discharge, temperature was 97.8, heart rate 61 per minute, respirations 18 per minute, blood pressure 114/73, and he was saturating 100% on room air.  HEART: S1 and S2 normal. No murmurs, rubs, or gallops.  LUNGS: Clear to auscultation bilaterally. No wheezing, rales, rhonchi, or crepitation.  ABDOMEN: Soft, benign.  NEUROLOGIC: Nonfocal examination. All other physical examination remained at baseline.   DISCHARGE MEDICATIONS: 1.  Acetaminophen/oxycodone 325/5 mg 1 tablet p.o. every 6 hours as needed.  2.  Zofran 4 mg p.o. every 8 hours as needed.  3.  Nicotine 14 mg patch transdermal daily.  4.  Omeprazole 40 mg p.o. daily.   DISCHARGE DIET: Low fat, low cholesterol.   DISCHARGE ACTIVITY: As tolerated.    DISCHARGE INSTRUCTIONS AND FOLLOW-UP: The patient was instructed to follow up with his primary care physician at [facility] in 2 to 4 weeks. He will [facility], Dr. Dow AdolphMatthew Rein, in 2 to 4 weeks.   Please note, he remains at very high risk for readmission.  TOTAL TIME DISCHARGING THIS PATIENT: 45 minutes.  ____________________________ Ellamae SiaVipul S. Sherryll BurgerShah, MD vss:sb D: 06/25/2014 07:03:35 ET T: 06/25/2014 07:21:45 ET JOB#: [id number]  cc: Saliyah Gillin S. Sherryll BurgerShah, MD, <Dictator> [facility] Dow AdolphMatthew Rein, MD Ellamae SiaVIPUL S [facility] MD ELECTRONICALLY SIGNED 06/25/2014 10:58

## 2014-12-11 NOTE — H&P (Signed)
Subjective/Chief Complaint Abdominal pain, dark vomit.   History of Present Illness Had total 3 episodes of pancreatitis  this year- each tome came to ER- and given meds- resolved. Yesterday morning- again had pain- epigastric 8,9/10 - on -off- came to ER- Lipase was 800, was given Oral meds and sent home.  Pain was worse today- and also vomited twice - dark coloured vomit.  pain 9/20- on off, No relieving factor. Goes to back.  Some loose stool. Have hemorrhoids- so sometimes have blood in stool- did not check this time. Uses tylenol and ibuprofen over the counter for pain 3 times / day. Does not drink alcohol, never diagnosed with gall stone.   Past History Chronic low back pain diverticulitis  Tonsiller surgery at 38 yr age.   Primary Physician [facility] [place] Status Full Code   Past Med/Surgical Hx:  Kidney Stones:   Pancreatitis:   Diverticulitis:   Chronic Back Pain:   None, patient reports no surgical history.:   ALLERGIES:  No Known Allergies:   Ketorolac: Do NOT Use  HOME MEDICATIONS: Medication Instructions Status  ondansetron 4 mg oral tablet 1 tab(s) orally every 8 hours, As Needed - for Nausea, Vomiting Active  acetaminophen-oxyCODONE 325 mg-5 mg oral tablet 1 tab(s) orally every 6 hours, As Needed - for Pain Active    Medications Ibuprofen over the counter 3 time daily.   Family and Social History:  Family History Diabetes Mellitus  Cancer  father died at 56 yr- lung cancer- was smoker. Bone and kidney cancer in family.   Social History positive  tobacco, positive  tobacco (Current within 1 year), positive ETOH, negative Illicit drugs, 2 pack cigarette daily for 15 yrs, drinks 2-3 beers in a week.   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   Review of Systems:  Subjective/Chief Complaint abdominal pain, vomit.   Fever/Chills No   Cough No   Sputum No   Abdominal Pain Yes   Diarrhea No   Constipation No    Nausea/Vomiting Yes   SOB/DOE No   Chest Pain No   Telemetry Reviewed NSR   Dysuria No   Tolerating PT Yes   Tolerating Diet No  Vomiting   Physical Exam:  GEN well developed, well nourished, no acute distress   HEENT pink conjunctivae, PERRL, hearing intact to voice, moist oral mucosa   NECK supple  No masses  thyroid not tender  trachea midline   RESP normal resp effort  clear BS  no use of accessory muscles   CARD regular rate  no murmur  No LE edema   ABD positive tenderness  no liver/spleen enlargement  no hernia  soft  normal BS  no Adominal Mass   EXTR negative cyanosis/clubbing, negative edema   SKIN normal to palpation, No rashes, skin turgor good   NEURO cranial nerves intact, negative rigidity, negative tremor, follows commands, motor/sensory function intact   PSYCH alert, A+O to time, place, person, good insight   Lab Results: Hepatic:  02-Nov-15 14:54   Bilirubin, Total 0.5  Alkaline Phosphatase 91 (46-116 NOTE: New Reference Range 03/09/14)  SGPT (ALT) 60 (14-63 NOTE: New Reference Range 03/09/14)  SGOT (AST) 19  Total Protein, Serum 7.1  Albumin, Serum 4.0  Routine Chem:  02-Nov-15 14:54   Glucose, Serum  106  BUN 12  Creatinine (comp) 1.06  Sodium, Serum 142  Potassium, Serum 4.7  Chloride, Serum 107  CO2, Serum 27  Calcium (Total), Serum 8.8  Osmolality (calc) 283  eGFR (African American) >60  eGFR (Non-African American) >60 (eGFR values <13m/min/1.73 m2 may be an indication of chronic kidney disease (CKD). Calculated eGFR, using the MRDR Study equation, is useful in  patients with stable renal function. The eGFR calculation will not be reliable in acutely ill patients when serum creatinine is changing rapidly. It is not useful in patients on dialysis. The eGFR calculation may not be applicable to patients at the low and high extremes of body sizes, pregnant women, and vegetarians.)  Anion Gap 8  Lipase  1142 (Result(s) reported  on 21 Jun 2014 at 03:27PM.)  Routine UA:  02-Nov-15 14:54   Color (UA) Yellow  Clarity (UA) Clear  Glucose (UA) Negative  Bilirubin (UA) Negative  Ketones (UA) Negative  Specific Gravity (UA) 1.028  Blood (UA) Negative  pH (UA) 5.0  Protein (UA) Negative  Nitrite (UA) Negative  Leukocyte Esterase (UA) Negative (Result(s) reported on 21 Jun 2014 at 03:55PM.)  RBC (UA) 1 /HPF  WBC (UA) 1 /HPF  Bacteria (UA) NONE SEEN  Epithelial Cells (UA) NONE SEEN  Mucous (UA) PRESENT (Result(s) reported on 21 Jun 2014 at 03:55PM.)  Routine Hem:  02-Nov-15 14:54   WBC (CBC) 9.8  RBC (CBC) 4.76  Hemoglobin (CBC) 15.2  Hematocrit (CBC) 45.8  Platelet Count (CBC) 221  MCV 96  MCH 32.0  MCHC 33.3  RDW 12.5  Neutrophil % 58.9  Lymphocyte % 30.5  Monocyte % 6.3  Eosinophil % 3.3  Basophil % 1.0  Neutrophil # 5.8  Lymphocyte # 3.0  Monocyte # 0.6  Eosinophil # 0.3  Basophil # 0.1 (Result(s) reported on 21 Jun 2014 at 03:26PM.)    Assessment/Admission Diagnosis Acute pancreatitis Possible upper GI bleed  vomiting Smoking   Plan 1) Ac pancreatitis   NPO, IV fluids, IV morphin and Oral norco for pain.    Check Lipid panel and [place]Q.    2) Vomiting   Symptomatic with Zofran IV  3) Upper gi bleed   NSIDS use   NPO, IV fluids, Protonic IV.   Gi consult.  4) Smoking   Councelled to quit for 4 min.   Nicotin patch.  total time spent in admission is 50 min.   Electronic Signatures: VVaughan Basta(MD)  (Signed [id number])  Authored: CHIEF COMPLAINT and HISTORY, PAST MEDICAL/SURGIAL HISTORY, ALLERGIES, HOME MEDICATIONS, OTHER MEDICATIONS, FAMILY AND SOCIAL HISTORY, REVIEW OF SYSTEMS, PHYSICAL EXAM, LABS, ASSESSMENT AND PLAN   Last Updated: 02-Nov-15 19:03 by VVaughan Basta(MD)

## 2014-12-11 NOTE — Consult Note (Signed)
PATIENT NAME:  Kyle DumasOVERMAN, Akshath W MR#:  [id number] DATE OF BIRTH:  1985/11/23  DATE OF CONSULTATION:  06/22/2014  REFERRING PHYSICIAN:  Hope PigeonVaibhavkumar G. Elisabeth PigeonVachhani, MD  CONSULTING PHYSICIAN:  Dow AdolphMatthew Danille Oppedisano, MD  REASON FOR CONSULTATION:  Acute pancreatitis.   HISTORY OF PRESENT ILLNESS:  Mr. Kyle Rasmussen is a 29 year old male with a past medical history notable for pancreatitis and chronic low back pain, who presented to the Emergency Room this morning for evaluation of abdominal pain, chest pain, nausea, and vomiting. Mr. Kyle Rasmussen reports this is similar to his previous episodes of pancreatitis, but somewhat worse. Sometime this morning, he developed multiple episodes of nausea and vomiting. He also reports some pain in his right upper quadrant and his chest that came along with this.   He does report that the vomit did have several quarter-size areas of red material that he suspects was blood. Prior to this, he did not have any other episodes of hematemesis. He also has not had a bowel movement in over 24 hours. He does note that when he does pass his stools, he does sometimes have some bright red blood on the toilet paper and on the outside of his stool and does think that he does indeed have hemorrhoids.   In terms of the abdominal pain, he did have a lipase in the Emergency Room. It was elevated to 1142. It has now come down to 101. He did not have a CT scan. He did have an ultrasound that did not show any evidence of gallstones. In terms of the pancreatitis, he does drink some alcohol, although he says he only drinks a few beers per week. His last drink he reports was several days prior to the onset of the episode of the pain and the nausea and vomiting.  PAST MEDICAL HISTORY: 1.  Acute pancreatitis.  2.  Chronic low back pain.  3.  Diverticulitis.   MEDICATIONS:  Zofran as needed.   ALLERGIES TO MEDICATIONS:  NKDA.   FAMILY HISTORY:  He does have a father who passed away of lung cancer. He does not  have any family history of GI malignancy that he is aware of.   SOCIAL HISTORY:  He is an ongoing smoker. He is an ongoing drinker, but does report that he only drinks 2 to 3 beers per week.   REVIEW OF SYSTEMS:  CONSTITUTIONAL:  No weight gain or weight loss.  No fever or chills. HEENT:  No oral lesions or sore throat. No vision changes. GASTROINTESTINAL:  See HPI.  HEME/LYMPH:  No easy bruising or bleeding. CARDIOVASCULAR:  No chest pain or dyspnea on exertion. GENITOURINARY:  No hematuria. INTEGUMENTARY:  No rashes or pruritus PSYCHIATRIC:  No depression/anxiety.  ENDOCRINE:  No heat/cold intolerance. No hair loss or skin changes. ALLERGIC/IMMUNOLOGIC:  Negative for hives. RESPIRATORY:  No cough. No shortness of breath.  MUSCULOSKELETAL:  No joint swelling or muscle pain.  PHYSICAL EXAMINATION: VITAL SIGNS:  Temperature is 97.3, pulse is 63, respirations are 17, blood pressure 121/75, and pulse oximetry is 99% on room air.  GENERAL:  Underweight-appearing male.  Alert and oriented times 4.  No acute distress. Appears stated age. HEENT:  Normocephalic/atraumatic. Extraocular movements are intact. Anicteric. NECK:  Soft, supple. JVP appears normal. No adenopathy. CHEST:  Clear to auscultation. No wheeze or crackle. Respirations are unlabored. HEART:  Regular. No murmur, rub, or gallop.  Normal S1 and S2. ABDOMEN:  Soft, nondistended.  Mild diffuse tenderness to palpation.  Normal active bowel sounds in  all four quadrants.  No organomegaly. No masses EXTREMITIES:  No swelling. Well perfused. SKIN:  No rash or lesion. Skin color, texture, and turgor are normal. NEUROLOGICAL:  Grossly intact. PSYCHIATRIC:  Normal tone and affect. MUSCULOSKELETAL:  No joint swelling or erythema.   LABORATORY DATA:  Basic metabolic panel was normal except for a chloride of 108. His triglycerides were normal. His lipase was 863, then 1142, and then 101. His liver enzymes are normal. ALT was slightly  elevated at 70, but has since come down. Albumin is 4.0. Troponin is negative. White count is 7.6, hemoglobin is 13, and hematocrit is 40.   IMAGING:  Ultrasound:  No evidence of gallstones.   ASSESSMENT AND PLAN:   1.  Acute pancreatitis:  Although he reports only having a few drinks of alcohol per week, I do suspect that the likely cause of the acute pancreatitis is in fact alcohol induced. His triglycerides are normal, and his ultrasound is negative. Fortunately, he is doing much better at this time. He is tolerating clear liquid diet well, and the pain, nausea, and vomiting have nearly resolved.   Plan:   1.  We will check blood for IgG4 and consideration of autoimmune pancreatitis.  2.  Continue to advance diet as tolerated.  3.  GI clinic in consideration of additional imaging of the pancreas.  4.  No further alcohol whatsoever to prevent recurrent episodes of pancreatitis.   2.  Small amount of blood in vomit:  It sounds as though it is just a few quarter-size spots, which is pretty unremarkable. His hemoglobin is normal.   Plan: 1.  Follow hemoglobin until stable.  2.  Follow up in GI clinic for consideration of upper endoscopy once out from acute pancreatitis episode.   Thank you for this consult.    ____________________________ Dow Adolph, MD mr:nb D: 06/22/2014 19:18:05 ET T: 06/22/2014 22:37:23 ET JOB#: [id number]  cc: Dow Adolph, MD, <Dictator> Kathalene Frames MD ELECTRONICALLY SIGNED 07/13/2014 15:32

## 2015-06-24 IMAGING — CR DG CHEST 2V
1 series · 2 of 2 positions shown · non-contrast
Comparison: 06/02/2014.

CLINICAL DATA: Chest pain for the past 2 hr.  Leukocytosis.

EXAM:
CHEST  2 VIEW

[Series 1: dxr chest pa (or ap) and lateral · 0.14mm/px · 2 of 2 slices shown]
[im 1/2]
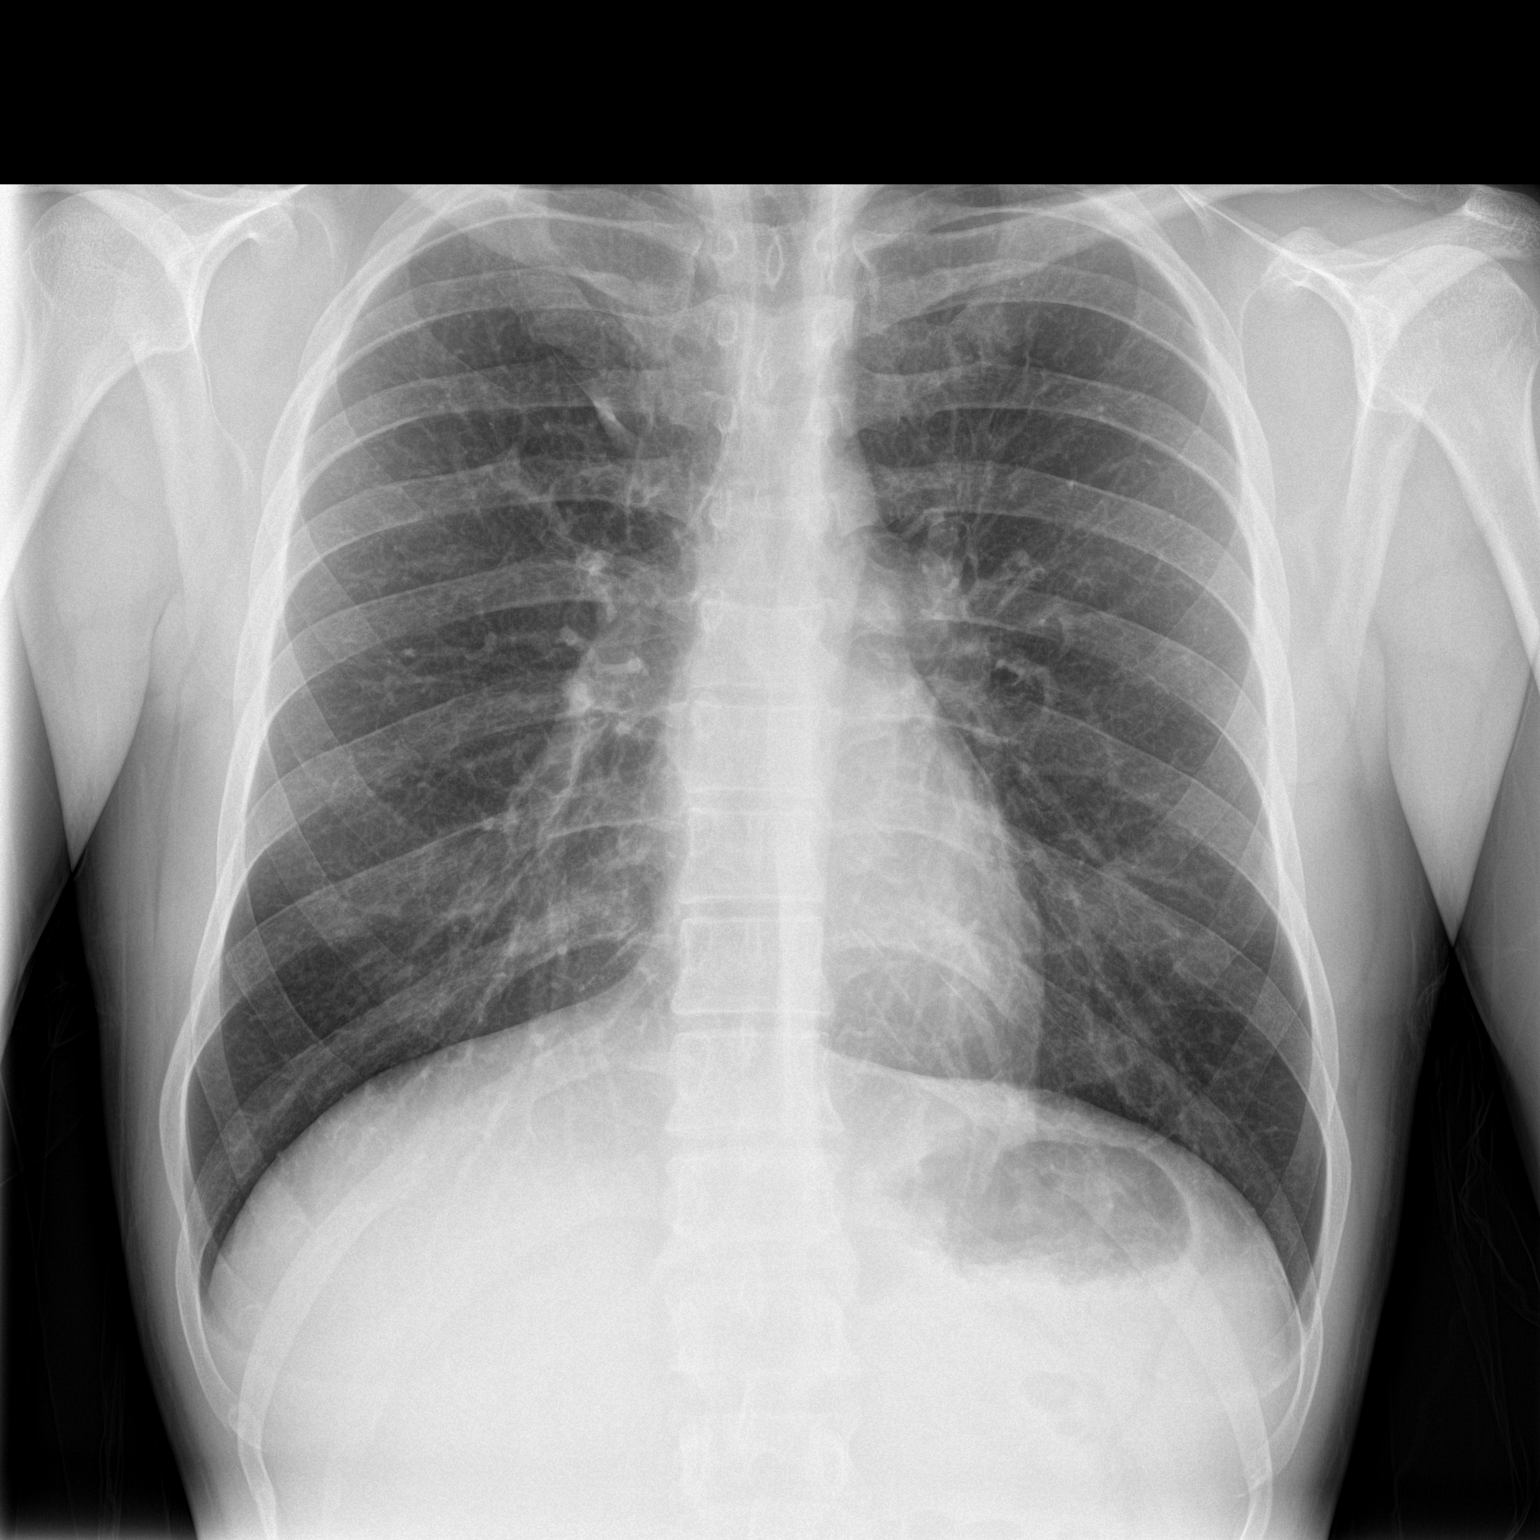
[im 2/2]
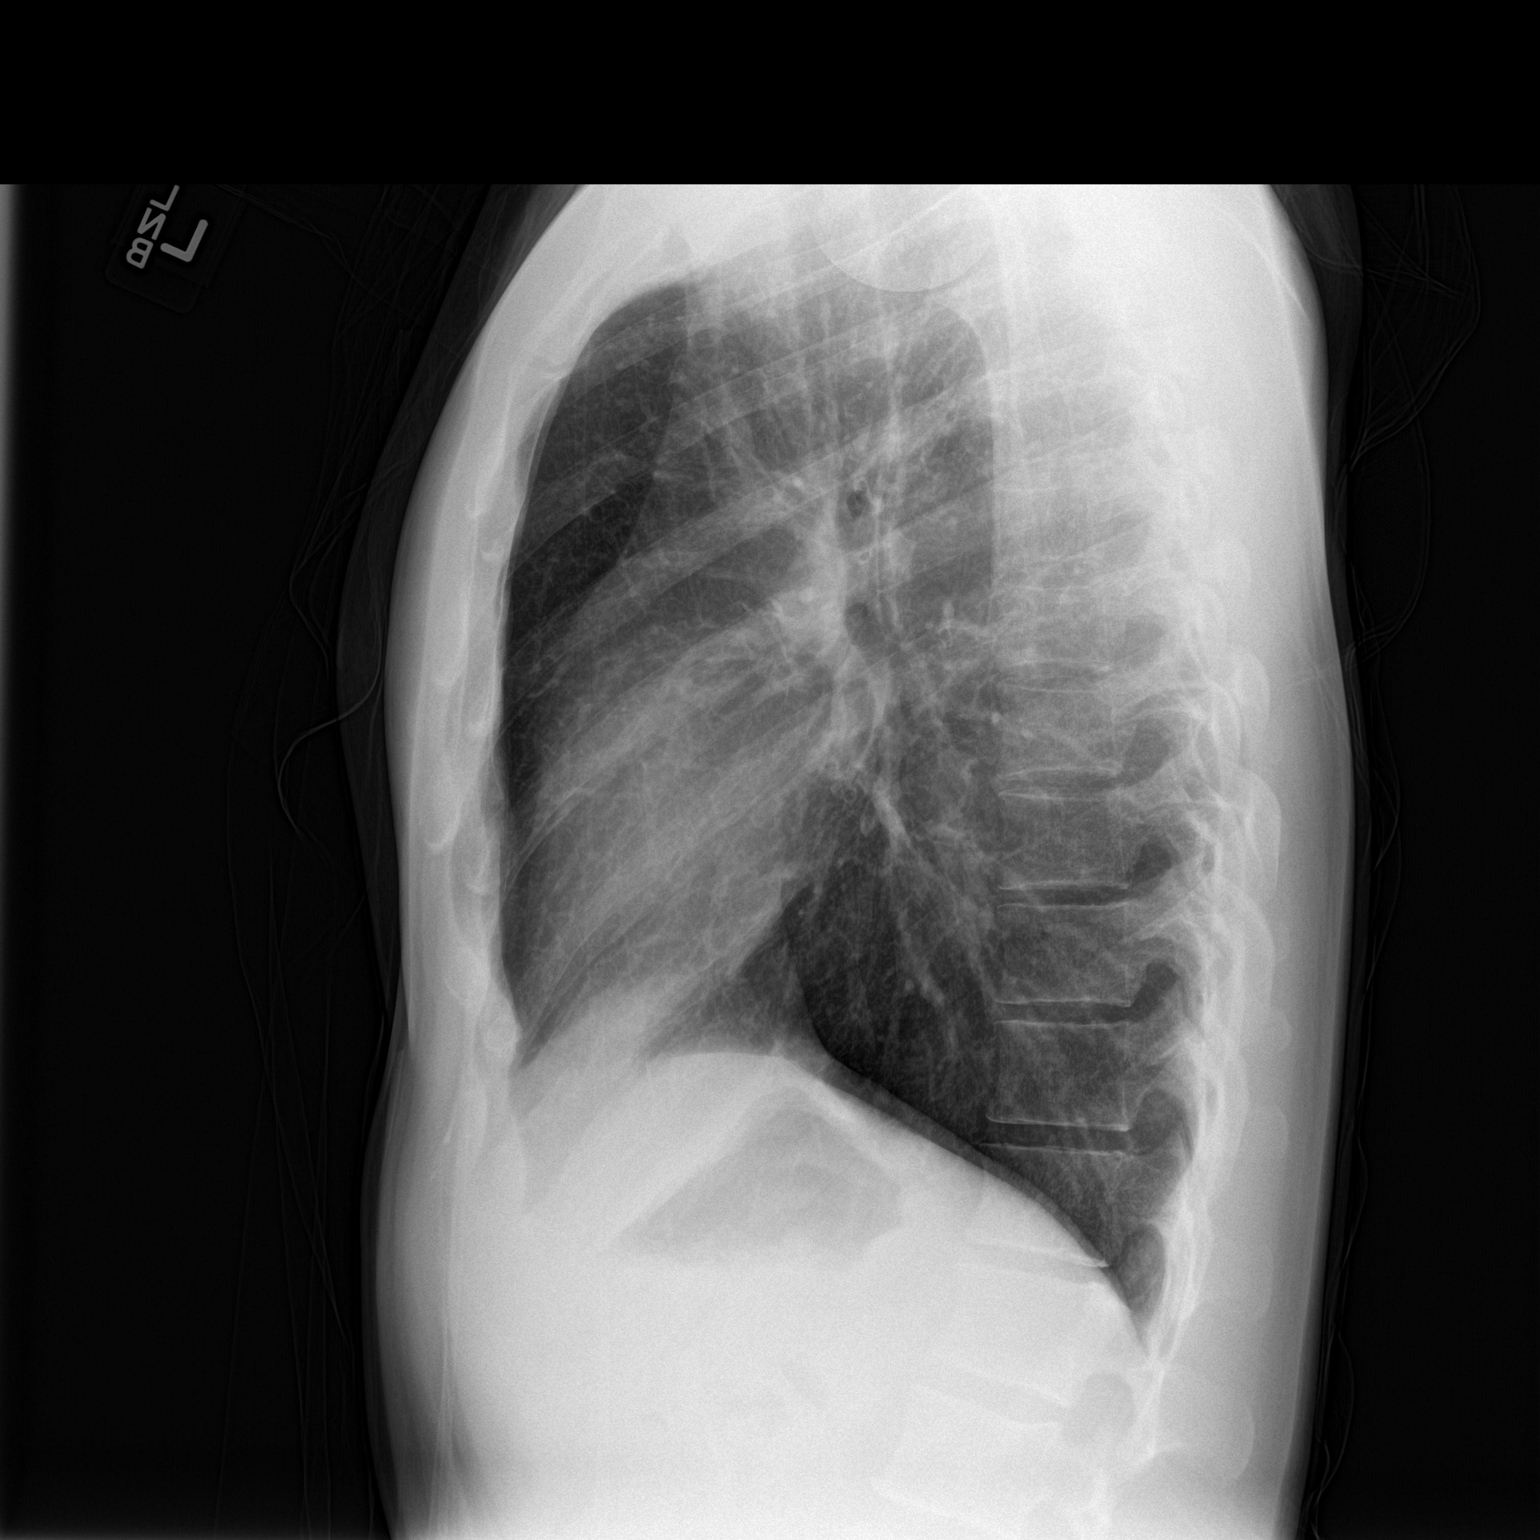

[2 of 2 positions shown; findings below may reference images not displayed]

FINDINGS: Normal sized heart. Clear lungs. Minimal central peribronchial
thickening. Unremarkable bones.
IMPRESSION: Stable minimal chronic bronchitic changes.  No acute abnormality.

## 2016-10-02 ENCOUNTER — Encounter: Payer: Self-pay | Admitting: Emergency Medicine

## 2016-10-02 ENCOUNTER — Emergency Department
Admission: EM | Admit: 2016-10-02 | Discharge: 2016-10-02 | Disposition: A | Discharge status: Home / Self Care | Payer: Self-pay | Attending: Emergency Medicine | Admitting: Emergency Medicine

## 2016-10-02 DIAGNOSIS — Y939 Activity, unspecified: Secondary | ICD-10-CM | POA: Insufficient documentation

## 2016-10-02 DIAGNOSIS — W109XXA Fall (on) (from) unspecified stairs and steps, initial encounter: Secondary | ICD-10-CM | POA: Insufficient documentation

## 2016-10-02 DIAGNOSIS — Y929 Unspecified place or not applicable: Secondary | ICD-10-CM | POA: Insufficient documentation

## 2016-10-02 DIAGNOSIS — F17200 Nicotine dependence, unspecified, uncomplicated: Secondary | ICD-10-CM | POA: Insufficient documentation

## 2016-10-02 DIAGNOSIS — M62830 Muscle spasm of back: Secondary | ICD-10-CM

## 2016-10-02 DIAGNOSIS — S39012A Strain of muscle, fascia and tendon of lower back, initial encounter: Secondary | ICD-10-CM | POA: Insufficient documentation

## 2016-10-02 DIAGNOSIS — Y999 Unspecified external cause status: Secondary | ICD-10-CM | POA: Insufficient documentation

## 2016-10-02 HISTORY — DX: Dorsalgia, unspecified: M54.9

## 2016-10-02 MED ORDER — ONDANSETRON 4 MG PO TBDP
4.0000 mg | ORAL_TABLET | Freq: Once | ORAL | Status: AC
  Administered 2016-10-02: 4 mg via ORAL
  Filled 2016-10-02: qty 1

## 2016-10-02 MED ORDER — KETOROLAC TROMETHAMINE 30 MG/ML IJ SOLN
30.0000 mg | Freq: Once | INTRAMUSCULAR | Status: AC
  Administered 2016-10-02: 30 mg via INTRAMUSCULAR
  Filled 2016-10-02: qty 1

## 2016-10-02 MED ORDER — CYCLOBENZAPRINE HCL 10 MG PO TABS: 10.0000 mg | ORAL_TABLET | Freq: Three times a day (TID) | ORAL | 0 refills | Status: DC | PRN

## 2016-10-02 MED ORDER — NAPROXEN 500 MG PO TABS: 500.0000 mg | ORAL_TABLET | Freq: Two times a day (BID) | ORAL | 0 refills | Status: DC

## 2016-10-02 NOTE — ED Triage Notes (Signed)
States he fell down some steps about 2 years ago developed increased lower back pain over the past 2-3 days

## 2016-10-02 NOTE — ED Notes (Signed)
See triage note  States his back pain is thur entire back at times  also has had some diff with urination at times

## 2016-10-02 NOTE — ED Provider Notes (Signed)
[facility] Emergency Department Provider Note  ____________________________________________  Time seen: Approximately 1:29 PM  I have reviewed the triage vital signs and the nursing notes.   HISTORY  Chief Complaint Back Pain    HPI Kyle Rasmussen is a 31 y.o. male , NAD, presents to the emergency department for evaluation of 3 day history of lower back pain. Patient states he has chronic history of lower back pain after an accident several years ago. States he has flares of lower back pain from time to time. 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 Has not been alleviated by over-the-counter medications. Has been treated in the past was relaxers with good efficacy. The changes in urinary or bowel habits. Has had no saddle paresthesias or loss of bowel or bladder control. Denies any recent injuries, traumas or falls. Has not noted any rashes or skin sores. Denies any fevers, chills or body aches.   Past Medical History:  Diagnosis Date  . Back pain     There are no active problems to display for this patient.   History reviewed. No pertinent surgical history.  Prior to Admission medications   Medication Sig Start Date End Date Taking? Authorizing Provider  cyclobenzaprine (FLEXERIL) 10 MG tablet Take 1 tablet (10 mg total) by mouth 3 (three) times daily as needed for muscle spasms. 10/02/16   Jami L Hagler, PA-C  naproxen (NAPROSYN) 500 MG tablet Take 1 tablet (500 mg total) by mouth 2 (two) times daily with a meal. 10/02/16   Jami L Hagler, PA-C    Allergies Toradol [ketorolac tromethamine]  No family history on file.  Social History Social History  Substance Use Topics  . Smoking status: Current Every Day Smoker  . Smokeless tobacco: Never Used  . Alcohol use No     Review of Systems  Constitutional: No fever/chills Gastrointestinal: No abdominal pain.  No nausea,  vomiting.   Genitourinary: Negative for dysuria, hematuria.  Musculoskeletal: Positive for lower back pain. No extremity pain Skin: Negative for rash, Redness, swelling, skin sores. Neurological: Negative for Numbness, weakness, tingling. No saddle paresthesias or loss of bowel or bladder control.  ____________________________________________   PHYSICAL EXAM:  VITAL SIGNS: ED Triage Vitals  Enc Vitals Group     BP 10/02/16 1228 134/77     Pulse Rate 10/02/16 1228 (!) 104     Resp 10/02/16 1228 20     Temp 10/02/16 1228 98.2 F (36.8 C)     Temp Source 10/02/16 1228 Oral     SpO2 10/02/16 1228 99 %     Weight 10/02/16 1227 150 lb (68 kg)     Height 10/02/16 1227 5\' 11"  (1.803 m)     Head Circumference --      Peak Flow --      Pain Score 10/02/16 1227 7     Pain Loc --      Pain Edu? --      Excl. in GC? --      Constitutional: Alert and oriented. Well appearing and in no acute distress. Eyes: Conjunctivae are normal.  Head: Atraumatic. Neck: Supple with full range of motion. No cervical spine tenderness to palpation. Hematological/Lymphatic/Immunilogical: No cervical lymphadenopathy. Cardiovascular:  Good peripheral circulation. Respiratory: Normal respiratory effort without tachypnea or retractions.  Musculoskeletal: Full range of motion of the lumbar spine without difficulty. No tenderness to outpatient of the midline  thoracic or lumbar spinal region. Moderate muscle spasm and tenderness to palpation of the right lower lumbar paraspinal region. No SI joint tenderness bilaterally. Negative straight leg raise bilaterally. No lower extremity tenderness nor edema.  No joint effusions. Neurologic:  Normal speech and language. No gross focal neurologic deficits are appreciated.  Skin:  Skin is warm, dry and intact. No rash, redness, swelling, bruising, skin sores noted. Psychiatric: Mood and affect are normal. Speech and behavior are normal. Patient exhibits appropriate insight  and judgement.   ____________________________________________   LABS  None ____________________________________________  EKG  None ____________________________________________  RADIOLOGY  None ____________________________________________    PROCEDURES  Procedure(s) performed: None   Procedures   Medications  ketorolac (TORADOL) 30 MG/ML injection 30 mg (30 mg Intramuscular Given 10/02/16 1352)  ondansetron (ZOFRAN-ODT) disintegrating tablet 4 mg (4 mg Oral Given 10/02/16 1352)     ____________________________________________   INITIAL IMPRESSION / ASSESSMENT AND PLAN / ED COURSE  Pertinent labs & imaging results that were available during my care of the patient were reviewed by me and considered in my medical decision making (see chart for details).     Patient's diagnosis is consistent with Strain of lumbar region with muscle spasm of the back. Patient was given IM Toradol and tolerated well with decrease in pain. Patient will be discharged home with prescriptions for Naprosyn and Flexeril to take as directed. [facility] if symptoms persist past this treatment course. Patient is given ED precautions to return to the ED for any worsening or new symptoms.    ____________________________________________  FINAL CLINICAL IMPRESSION(S) / ED DIAGNOSES  Final diagnoses:  Strain of lumbar region, initial encounter  Muscle spasm of back      NEW MEDICATIONS STARTED DURING THIS VISIT:  Discharge Medication List as of 10/02/2016  2:25 PM    START taking these medications   Details  cyclobenzaprine (FLEXERIL) 10 MG tablet Take 1 tablet (10 mg total) by mouth 3 (three) times daily as needed for muscle spasms., Starting Tue 10/02/2016, Print    naproxen (NAPROSYN) 500 MG tablet Take 1 tablet (500 mg total) by mouth 2 (two) times daily with a meal., Starting Tue 10/02/2016, Print             Ernestene KielJami L Charlotte HarborHagler,  PA-C 10/02/16 1510    Arnaldo NatalPaul F Malinda, MD 10/02/16 1526

## 2016-10-08 ENCOUNTER — Emergency Department
Admission: EM | Admit: 2016-10-08 | Discharge: 2016-10-08 | Discharge status: Against Medical Advice | Payer: Self-pay | Attending: Emergency Medicine | Admitting: Emergency Medicine

## 2016-10-08 ENCOUNTER — Encounter: Payer: Self-pay | Admitting: *Deleted

## 2016-10-08 DIAGNOSIS — G8929 Other chronic pain: Secondary | ICD-10-CM | POA: Insufficient documentation

## 2016-10-08 DIAGNOSIS — M545 Low back pain: Secondary | ICD-10-CM | POA: Insufficient documentation

## 2016-10-08 DIAGNOSIS — F17200 Nicotine dependence, unspecified, uncomplicated: Secondary | ICD-10-CM | POA: Insufficient documentation

## 2016-10-08 NOTE — ED Notes (Signed)
Examined by PA  Sent to bathroom for u/a

## 2016-10-08 NOTE — ED Provider Notes (Signed)
[facility] Emergency Department Provider Note  ____________________________________________   First MD Initiated Contact with Patient 10/08/16 1426     (approximate)  I have reviewed the triage vital signs and the nursing notes.   HISTORY  Chief Complaint Back Pain    HPI Kyle Rasmussen is a 31 y.o. male severe complaint of continued back pain. Patient states that he was in the emergency room last week and was given naproxen and Flexeril which he  discontinued taking when he saw that is not helping. Patient has a history of a back injury" several years ago" when he fell down a flight of stairs. He is uncertain of the name of the orthopedist that he saw at the time but has not followed up with anyone since. He denies any urinary symptoms or history of kidney stones. He does state that sometimes he has difficulty starting to urinate. He denies any hematuria or discharge. He denies any prior problems with his prostate. He denies any uses into his lower extremities. There is been no incontinence of bowel or bladder and no saddle anesthesia.  He states that now the pain radiates from his lower back up into his neck.He rates his pain as a 10 over 10.   Past Medical History:  Diagnosis Date  . Back pain     There are no active problems to display for this patient.   History reviewed. No pertinent surgical history.  Prior to Admission medications   Medication Sig Start Date End Date Taking? Authorizing Provider  cyclobenzaprine (FLEXERIL) 10 MG tablet Take 1 tablet (10 mg total) by mouth 3 (three) times daily as needed for muscle spasms. 10/02/16   Jami L Hagler, PA-C  naproxen (NAPROSYN) 500 MG tablet Take 1 tablet (500 mg total) by mouth 2 (two) times daily with a meal. 10/02/16   Jami L Hagler, PA-C    Allergies Toradol [ketorolac tromethamine]  History reviewed. No pertinent family history.  Social History Social History  Substance Use Topics  .  Smoking status: Current Every Day Smoker  . Smokeless tobacco: Never Used  . Alcohol use No    Review of Systems Constitutional: No fever/chills ENT: No sore throat. Cardiovascular: Denies chest pain. Respiratory: Denies shortness of breath. Gastrointestinal: No abdominal pain.  No nausea, no vomiting.  No diarrhea.  No constipation. Genitourinary: Negative for dysuria. Negative for history of kidney stones. Musculoskeletal: Positive for chronic back pain. Skin: Negative for rash. Neurological: Negative for headaches, focal weakness or numbness.  10-point ROS otherwise negative.  ____________________________________________   PHYSICAL EXAM:  VITAL SIGNS: ED Triage Vitals [10/08/16 1402]  Enc Vitals Group     BP 119/74     Pulse Rate 88     Resp 18     Temp 98.3 F (36.8 C)     Temp Source Oral     SpO2 99 %     Weight 150 lb (68 kg)     Height 5\' 11"  (1.803 m)     Head Circumference      Peak Flow      Pain Score 10     Pain Loc      Pain Edu?      Excl. in GC?     Constitutional: Alert and oriented. Well appearing and in no acute distress. Patient was observed taking his coat off without any assistance and without difficulty. Eyes: Conjunctivae are normal. PERRL. EOMI. Head: Atraumatic. Nose: No congestion/rhinnorhea. Neck: No stridor.  Cardiovascular: Normal rate, regular rhythm. Grossly normal heart sounds.  Good peripheral circulation. Respiratory: Normal respiratory effort.  No retractions. Lungs CTAB. Musculoskeletal: On examination of the back there is no gross deformity noted. There is minimal point tenderness on palpation of the vertebral bodies thoracic through lumbosacral. Range of motion is restricted secondary to pain however there is no active muscle spasm seen. Straight leg raises were 90 without difficulty. Normal gait was noted. Neurologic:  Normal speech and language. No gross focal neurologic deficits are appreciated. Reflexes were 2+  bilaterally. No gait instability. Skin:  Skin is warm, dry and intact. No rash noted. Psychiatric: Mood and affect are normal. Speech and behavior are normal.  ____________________________________________   LABS (all labs ordered are listed, but only abnormal results are displayed)  Labs Reviewed - No data to display   PROCEDURES  Procedure(s) performed: None  Procedures  Critical Care performed: No  ____________________________________________   INITIAL IMPRESSION / ASSESSMENT AND PLAN / ED COURSE  Pertinent labs & imaging results that were available during my care of the patient were reviewed by me and considered in my medical decision making (see chart for details).  With patient's history of urinary hesitancy urinalysis was ordered and. Patient was aware that he was going to have a prostate exam to see if this is some of the reason his back is hurting. Patient went to the restroom and did leave a urine specimen in the room. Shortly after he and his male family member eloped from the room. Urinalysis was canceled.      ____________________________________________   FINAL CLINICAL IMPRESSION(S) / ED DIAGNOSES  Final diagnoses:  Chronic bilateral low back pain without sciatica      NEW MEDICATIONS STARTED DURING THIS VISIT:  Discharge Medication List as of 10/08/2016  3:30 PM       Note:  This document was prepared using Dragon voice recognition software and may include unintentional dictation errors.    Tommi RumpsRhonda L Summers, PA-C 10/08/16 1606    Nita Sicklearolina Veronese, MD 10/11/16 [phone]

## 2016-10-08 NOTE — ED Notes (Signed)
See triage note  States he was seen last week for same  conts to have entire back pain  Pain increases with cough and inspiration  Had fever about 1-2 weeks ago afebrile on arrival hx of chronic lower back pain d/t old injury  But denies any recent injury    Ambulates well to treatment room  Denies any bowel problems  But is having increased problem with urination

## 2016-10-08 NOTE — ED Triage Notes (Signed)
States lower back pain chronically and states for the past few weeks the pain has been shooting up her back to his neck

## 2016-10-08 NOTE — ED Notes (Signed)
Pt and family not in room  U/a sitting on counter  PA aware

## 2019-10-21 ENCOUNTER — Emergency Department: Payer: Self-pay

## 2019-10-21 ENCOUNTER — Encounter: Payer: Self-pay | Admitting: Emergency Medicine

## 2019-10-21 ENCOUNTER — Other Ambulatory Visit: Payer: Self-pay

## 2019-10-21 ENCOUNTER — Emergency Department
Admission: EM | Admit: 2019-10-21 | Discharge: 2019-10-21 | Disposition: A | Discharge status: Home / Self Care | Payer: Self-pay | Attending: Emergency Medicine | Admitting: Emergency Medicine

## 2019-10-21 DIAGNOSIS — M546 Pain in thoracic spine: Secondary | ICD-10-CM | POA: Insufficient documentation

## 2019-10-21 DIAGNOSIS — F17210 Nicotine dependence, cigarettes, uncomplicated: Secondary | ICD-10-CM | POA: Insufficient documentation

## 2019-10-21 LAB — URINE DRUG SCREEN, QUALITATIVE (ARMC ONLY)
Amphetamines, Ur Screen: NOT DETECTED
Barbiturates, Ur Screen: NOT DETECTED
Benzodiazepine, Ur Scrn: NOT DETECTED
Cannabinoid 50 Ng, Ur ~~LOC~~: POSITIVE — AB
Cocaine Metabolite,Ur ~~LOC~~: NOT DETECTED
MDMA (Ecstasy)Ur Screen: NOT DETECTED
Methadone Scn, Ur: NOT DETECTED
Opiate, Ur Screen: NOT DETECTED
Phencyclidine (PCP) Ur S: NOT DETECTED
Tricyclic, Ur Screen: NOT DETECTED

## 2019-10-21 LAB — CBC WITH DIFFERENTIAL/PLATELET
Abs Immature Granulocytes: 0.03 10*3/uL (ref 0.00–0.07)
Basophils Absolute: 0.1 10*3/uL (ref 0.0–0.1)
Basophils Relative: 1 %
Eosinophils Absolute: 0.6 10*3/uL — ABNORMAL HIGH (ref 0.0–0.5)
Eosinophils Relative: 7 %
HCT: 47.3 % (ref 39.0–52.0)
Hemoglobin: 16 g/dL (ref 13.0–17.0)
Immature Granulocytes: 0 %
Lymphocytes Relative: 28 %
Lymphs Abs: 2.4 10*3/uL (ref 0.7–4.0)
MCH: 31.6 pg (ref 26.0–34.0)
MCHC: 33.8 g/dL (ref 30.0–36.0)
MCV: 93.5 fL (ref 80.0–100.0)
Monocytes Absolute: 0.5 10*3/uL (ref 0.1–1.0)
Monocytes Relative: 6 %
Neutro Abs: 4.8 10*3/uL (ref 1.7–7.7)
Neutrophils Relative %: 58 %
Platelets: 229 10*3/uL (ref 150–400)
RBC: 5.06 MIL/uL (ref 4.22–5.81)
RDW: 11.9 % (ref 11.5–15.5)
WBC: 8.3 10*3/uL (ref 4.0–10.5)
nRBC: 0 % (ref 0.0–0.2)

## 2019-10-21 LAB — COMPREHENSIVE METABOLIC PANEL
ALT: 22 U/L (ref 0–44)
AST: 21 U/L (ref 15–41)
Albumin: 4.8 g/dL (ref 3.5–5.0)
Alkaline Phosphatase: 59 U/L (ref 38–126)
Anion gap: 4 — ABNORMAL LOW (ref 5–15)
BUN: 15 mg/dL (ref 6–20)
CO2: 30 mmol/L (ref 22–32)
Calcium: 9.5 mg/dL (ref 8.9–10.3)
Chloride: 103 mmol/L (ref 98–111)
Creatinine, Ser: 0.86 mg/dL (ref 0.61–1.24)
GFR calc Af Amer: >60 mL/min (ref >60)
GFR calc non Af Amer: >60 mL/min (ref >60)
Glucose, Bld: 99 mg/dL (ref 70–99)
Potassium: 4.1 mmol/L (ref 3.5–5.1)
Sodium: 137 mmol/L (ref 135–145)
Total Bilirubin: 0.9 mg/dL (ref 0.3–1.2)
Total Protein: 7.5 g/dL (ref 6.5–8.1)

## 2019-10-21 LAB — CHLAMYDIA/NGC RT PCR (ARMC ONLY)
Chlamydia Tr: NOT DETECTED
N gonorrhoeae: NOT DETECTED

## 2019-10-21 LAB — URINALYSIS, COMPLETE (UACMP) WITH MICROSCOPIC
Bacteria, UA: NONE SEEN
Bilirubin Urine: NEGATIVE
Glucose, UA: NEGATIVE mg/dL
Hgb urine dipstick: NEGATIVE
Ketones, ur: NEGATIVE mg/dL
Leukocytes,Ua: NEGATIVE
Nitrite: NEGATIVE
Protein, ur: NEGATIVE mg/dL
Specific Gravity, Urine: 1.023 (ref 1.005–1.030)
Squamous Epithelial / LPF: NONE SEEN (ref 0–5)
WBC, UA: NONE SEEN WBC/hpf (ref 0–5)
pH: 6 (ref 5.0–8.0)

## 2019-10-21 MED ORDER — ORPHENADRINE CITRATE 30 MG/ML IJ SOLN
60.0000 mg | Freq: Two times a day (BID) | INTRAMUSCULAR | Status: DC
  Administered 2019-10-21: 60 mg via INTRAVENOUS
  Filled 2019-10-21: qty 2

## 2019-10-21 MED ORDER — MELOXICAM 15 MG PO TABS: 15.0000 mg | ORAL_TABLET | Freq: Every day | ORAL | 0 refills | Status: AC

## 2019-10-21 MED ORDER — CYCLOBENZAPRINE HCL 10 MG PO TABS: 10.0000 mg | ORAL_TABLET | Freq: Three times a day (TID) | ORAL | 0 refills | Status: AC | PRN

## 2019-10-21 NOTE — ED Notes (Signed)
Pt out of the room bleeding down his left arm. In to pt room, pt has removed his own IV. Blood noted on floor, doors, down the arm and on clothing. Advised pt not to remove his own IV.

## 2019-10-21 NOTE — ED Triage Notes (Addendum)
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

## 2019-10-21 NOTE — ED Notes (Signed)
Into room. Pt not present.

## 2019-10-21 NOTE — Discharge Instructions (Signed)
There are no worrisome findings on any of the tests performed today. Symptoms most likely related to your daily activities. Take the muscle relaxer and anti-inflammatory as prescribed. Follow up with primary care.

## 2019-10-21 NOTE — ED Provider Notes (Signed)
[facility] Emergency Department Provider Note ____________________________________________  Time seen: Approximately 3:31 PM  I have reviewed the triage vital signs and the nursing notes.  HISTORY  Chief Complaint Back Pain   HPI Kyle Rasmussen is a 34 y.o. male who presents to the emergency department for treatment and evaluation of back pain, bilateral lower extremity pain with "weakness" and difficulty urinating. 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 Urinary symptoms started about a month ago. He reports difficulty starting urine stream and only urinating small amounts as well as frequency. He denies dysuria or concern for STD. Mid to upper back pain started about a week ago. Pain is a constant ache with intermittent sharp twinges and is worse at night. No relief with ibuprofen or stretches. He denies IV drug use.   Past Medical History:  Diagnosis Date  . Back pain     There are no problems to display for this patient.   History reviewed. No pertinent surgical history.  Prior to Admission medications   Medication Sig Start Date End Date Taking? Authorizing Provider  cyclobenzaprine (FLEXERIL) 10 MG tablet Take 1 tablet (10 mg total) by mouth 3 (three) times daily as needed. 10/21/19   Kilah Drahos, Johnette Abraham B, FNP  meloxicam (MOBIC) 15 MG tablet Take 1 tablet (15 mg total) by mouth daily. 10/21/19   Victorino Dike, FNP    Allergies Toradol [ketorolac tromethamine]  History reviewed. No pertinent family history.  Social History Social History   Tobacco Use  . Smoking status: Current Every Day Smoker    Types: Cigarettes  . Smokeless tobacco: Never Used  Substance Use Topics  . Alcohol use: No  . Drug use: Yes    Types: Marijuana    Comment: last smoked this am    Review of Systems Constitutional: Well appearing. Respiratory: Negative for dyspnea. Cardiovascular: Negative for change in skin temperature or color. Musculoskeletal:    Negative for chronic steroid use   Negative for trauma in the presence of osteoporosis  Negative for age over 49 and trauma.  Negative for constitutional symptoms, or history of cancer   Positive for pain worse at night. Skin: Negative for rash, lesion, or wound.  Genitourinary: Negative for urinary retention.  Positive for difficulty starting urine stream and positive for frequency Rectal: Negative for fecal incontinence or new onset constipation/bowel habit changes. Hematological/Immunilogical: Negative for immunosuppression, IV drug use, or fever Neurological: Negative for burning, tingling, numb, electric, radiating pain in the lower extremities.                        Negative for saddle anesthesia.                        Negative for focal neurologic deficit, progressive or disabling symptoms             Negative for saddle anesthesia. ____________________________________________   PHYSICAL EXAM:  VITAL SIGNS: ED Triage Vitals  Enc Vitals Group     BP 10/21/19 1507 (!) 143/90     Pulse Rate 10/21/19 1507 78     Resp 10/21/19 1507 16     Temp 10/21/19 1507 98 F (36.7 C)     Temp Source 10/21/19 1507 Oral     SpO2 10/21/19 1507 100 %     Weight 10/21/19 1510 150 lb 12.7 oz (68.4 kg)     Height 10/21/19 1510 5\' 11"  (1.803 m)  Head Circumference --      Peak Flow --      Pain Score 10/21/19 1509 6     Pain Loc --      Pain Edu? --      Excl. in GC? --     Constitutional: Alert and oriented. Well appearing and in no acute distress. Eyes: Conjunctivae are clear without discharge or drainage.  Head: Atraumatic. Neck: Full, active range of motion. Respiratory: Respirations even and unlabored. Musculoskeletal: Limited ROM of the back and extremities, Strength 5/5 of the lower extremities as tested. Neurologic: Reflexes of the lower extremities are 2+.  Negative straight leg raise on the right and left side. Skin: Atraumatic.  Psychiatric: Behavior and affect are  normal.  ____________________________________________   LABS (all labs ordered are listed, but only abnormal results are displayed)  Labs Reviewed  COMPREHENSIVE METABOLIC PANEL - Abnormal; Notable for the following components:      Result Value   Anion gap 4 (*)    All other components within normal limits  CBC WITH DIFFERENTIAL/PLATELET - Abnormal; Notable for the following components:   Eosinophils Absolute 0.6 (*)    All other components within normal limits  URINALYSIS, COMPLETE (UACMP) WITH MICROSCOPIC - Abnormal; Notable for the following components:   Color, Urine YELLOW (*)    APPearance HAZY (*)    All other components within normal limits  URINE DRUG SCREEN, QUALITATIVE ([facility] ONLY) - Abnormal; Notable for the following components:   Cannabinoid 50 Ng, Ur [place] POSITIVE (*)    All other components within normal limits  CHLAMYDIA/NGC RT PCR ([facility] ONLY)   ____________________________________________  RADIOLOGY  DG image of the thoracic spine is negative for acute findings. ____________________________________________   PROCEDURES  Procedure(s) performed:  Procedures ____________________________________________   INITIAL IMPRESSION / ASSESSMENT AND PLAN   Kyle Rasmussen is a 34 y.o. male presenting to the emergency department for treatment and evaluation of back pain and other symptoms as described in the HPI.  Initial plan will be to collect a urine, get a basic screening labs, and get an x-ray.  Diagnosis includes but is not limited to:   Musculoskeletal strain, degenerative disc disease of the thoracic spine, bulging or herniated disc of the thoracic spine, spinal epidural abscess, myelopathy  Acute cystitis, STI, prostatitis, enlarged prostate  ED Course:  Labs, urinalysis, and x-ray are all reassuring.  At this point I do not have a good reason for the combination of patient's current symptoms.  Although epidural abscess or any degree of cord  compression is less likely, it is reasonable to ensure that this is not the cause of the patient's collection of symptoms.  MRI of the thoracic spine was offered to the patient who would like to proceed with imaging.  ----------------------------------------- 7:47 PM on 10/21/2019 -----------------------------------------  MRI shows no acute concerns. He will be discharged home to follow up with primary care. He will be prescribed flexeril and meloxicam and given a work excuse for tomorrow. He is to return to the ER for concerns if unable to see primary care.  Medications  orphenadrine (NORFLEX) injection 60 mg (60 mg Intravenous Given 10/21/19 1635)    ED Discharge Orders         Ordered    cyclobenzaprine (FLEXERIL) 10 MG tablet  3 times daily PRN     10/21/19 1943    meloxicam (MOBIC) 15 MG tablet  Daily     10/21/19 1943  Pertinent labs & imaging results that were available during my care of the patient were reviewed by me and considered in my medical decision making (see chart for details).  _________________________________________   FINAL CLINICAL IMPRESSION(S) / ED DIAGNOSES  Final diagnoses:  Acute midline thoracic back pain     If controlled substance prescribed during this visit, 12 month history viewed on the [facility] prior to issuing an initial prescription for Schedule II or III opiod.   Chinita Pester, FNP 10/21/19 1948    Phineas Semen, MD 10/28/19 1504

## 2019-10-21 NOTE — ED Notes (Signed)
Pt to the bathroom

## 2020-10-26 ENCOUNTER — Other Ambulatory Visit: Payer: Self-pay

## 2020-10-26 ENCOUNTER — Emergency Department
Admission: EM | Admit: 2020-10-26 | Discharge: 2020-10-26 | Disposition: A | Discharge status: Home / Self Care | Payer: Self-pay | Attending: Emergency Medicine | Admitting: Emergency Medicine

## 2020-10-26 ENCOUNTER — Emergency Department: Payer: Self-pay

## 2020-10-26 DIAGNOSIS — R1010 Upper abdominal pain, unspecified: Secondary | ICD-10-CM

## 2020-10-26 DIAGNOSIS — R1011 Right upper quadrant pain: Secondary | ICD-10-CM | POA: Insufficient documentation

## 2020-10-26 DIAGNOSIS — F17210 Nicotine dependence, cigarettes, uncomplicated: Secondary | ICD-10-CM | POA: Insufficient documentation

## 2020-10-26 HISTORY — DX: Acute pancreatitis without necrosis or infection, unspecified: K85.90

## 2020-10-26 LAB — CBC
HCT: 46.6 % (ref 39.0–52.0)
Hemoglobin: 16.1 g/dL (ref 13.0–17.0)
MCH: 31.9 pg (ref 26.0–34.0)
MCHC: 34.5 g/dL (ref 30.0–36.0)
MCV: 92.3 fL (ref 80.0–100.0)
Platelets: 246 10*3/uL (ref 150–400)
RBC: 5.05 MIL/uL (ref 4.22–5.81)
RDW: 11.7 % (ref 11.5–15.5)
WBC: 8.5 10*3/uL (ref 4.0–10.5)
nRBC: 0 % (ref 0.0–0.2)

## 2020-10-26 LAB — COMPREHENSIVE METABOLIC PANEL
ALT: 12 U/L (ref 0–44)
AST: 14 U/L — ABNORMAL LOW (ref 15–41)
Albumin: 4.7 g/dL (ref 3.5–5.0)
Alkaline Phosphatase: 70 U/L (ref 38–126)
Anion gap: 8 (ref 5–15)
BUN: 11 mg/dL (ref 6–20)
CO2: 26 mmol/L (ref 22–32)
Calcium: 9.4 mg/dL (ref 8.9–10.3)
Chloride: 105 mmol/L (ref 98–111)
Creatinine, Ser: 0.89 mg/dL (ref 0.61–1.24)
GFR, Estimated: >60 mL/min (ref >60)
Glucose, Bld: 101 mg/dL — ABNORMAL HIGH (ref 70–99)
Potassium: 4.1 mmol/L (ref 3.5–5.1)
Sodium: 139 mmol/L (ref 135–145)
Total Bilirubin: 0.8 mg/dL (ref 0.3–1.2)
Total Protein: 7.6 g/dL (ref 6.5–8.1)

## 2020-10-26 LAB — URINALYSIS, COMPLETE (UACMP) WITH MICROSCOPIC
Bacteria, UA: NONE SEEN
Bilirubin Urine: NEGATIVE
Glucose, UA: NEGATIVE mg/dL
Hgb urine dipstick: NEGATIVE
Ketones, ur: NEGATIVE mg/dL
Leukocytes,Ua: NEGATIVE
Nitrite: NEGATIVE
Protein, ur: NEGATIVE mg/dL
Specific Gravity, Urine: 1.023 (ref 1.005–1.030)
pH: 5 (ref 5.0–8.0)

## 2020-10-26 LAB — LIPASE, BLOOD: Lipase: 67 U/L — ABNORMAL HIGH (ref 11–51)

## 2020-10-26 MED ORDER — PANTOPRAZOLE SODIUM 40 MG PO TBEC: 40.0000 mg | DELAYED_RELEASE_TABLET | Freq: Every day | ORAL | 1 refills | Status: AC

## 2020-10-26 NOTE — ED Provider Notes (Signed)
[facility] Emergency Department Provider Note  ____________________________________________   Event Date/Time   First MD Initiated Contact with Patient 10/26/20 1353     (approximate)  I have reviewed the triage vital signs and the nursing notes.   HISTORY  Chief Complaint Abdominal Pain    HPI Kyle Rasmussen is a 35 y.o. male presents to the emergency department complaining of upper abdominal pain for about 2 weeks.  Also noted some bright red blood in the stools.  History of hemorrhoids.  He also has a history of pancreatitis.  States he is not vomiting like he was when he had pancreatitis.  No fever or chills.    Past Medical History:  Diagnosis Date   Back pain    Pancreatitis     There are no problems to display for this patient.   Past Surgical History:  Procedure Laterality Date   TONSILLECTOMY      Prior to Admission medications   Medication Sig Start Date End Date Taking? Authorizing Provider  pantoprazole (PROTONIX) 40 MG tablet Take 1 tablet (40 mg total) by mouth daily. 10/26/20 10/26/21 Yes Ottavio Norem, Roselyn Bering, PA-C  cyclobenzaprine (FLEXERIL) 10 MG tablet Take 1 tablet (10 mg total) by mouth 3 (three) times daily as needed. 10/21/19   Triplett, Rulon Eisenmenger B, FNP  meloxicam (MOBIC) 15 MG tablet Take 1 tablet (15 mg total) by mouth daily. 10/21/19   Chinita Pester, FNP    Allergies Toradol [ketorolac tromethamine]  No family history on file.  Social History Social History   Tobacco Use   Smoking status: Current Every Day Smoker    Types: Cigarettes   Smokeless tobacco: Never Used  Substance Use Topics   Alcohol use: No   Drug use: Yes    Types: Marijuana    Comment: last smoked this am    Review of Systems  Constitutional: No fever/chills Eyes: No visual changes. ENT: No sore throat. Respiratory: Denies cough Cardiovascular: Denies chest pain Gastrointestinal: Positive abdominal pain Genitourinary: Negative for  dysuria. Musculoskeletal: Negative for back pain. Skin: Negative for rash. Psychiatric: no mood changes,     ____________________________________________   PHYSICAL EXAM:  VITAL SIGNS: ED Triage Vitals  Enc Vitals Group     BP 10/26/20 1326 137/84     Pulse Rate 10/26/20 1326 65     Resp 10/26/20 1328 16     Temp 10/26/20 1328 97.6 F (36.4 C)     Temp src --      SpO2 10/26/20 1326 100 %     Weight 10/26/20 1328 170 lb (77.1 kg)     Height 10/26/20 1328 5\' 11"  (1.803 m)     Head Circumference --      Peak Flow --      Pain Score 10/26/20 1328 6     Pain Loc --      Pain Edu? --      Excl. in GC? --     Constitutional: Alert and oriented. Well appearing and in no acute distress. Eyes: Conjunctivae are normal.  Head: Atraumatic. Nose: No congestion/rhinnorhea. Mouth/Throat: Mucous membranes are moist.   Neck:  supple no lymphadenopathy noted Cardiovascular: Normal rate, regular rhythm. Heart sounds are normal Respiratory: Normal respiratory effort.  No retractions, lungs c t a  Abd: soft tender in the epigastric area bs normal all 4 quad GU: deferred Musculoskeletal: FROM all extremities, warm and well perfused Neurologic:  Normal speech and language.  Skin:  Skin is warm,  dry and intact. No rash noted. Psychiatric: Mood and affect are normal. Speech and behavior are normal.  ____________________________________________   LABS (all labs ordered are listed, but only abnormal results are displayed)  Labs Reviewed  LIPASE, BLOOD - Abnormal; Notable for the following components:      Result Value   Lipase 67 (*)    All other components within normal limits  COMPREHENSIVE METABOLIC PANEL - Abnormal; Notable for the following components:   Glucose, Bld 101 (*)    AST 14 (*)    All other components within normal limits  URINALYSIS, COMPLETE (UACMP) WITH MICROSCOPIC - Abnormal; Notable for the following components:   Color, Urine YELLOW (*)    APPearance HAZY (*)     All other components within normal limits  CBC   ____________________________________________   ____________________________________________  RADIOLOGY  Ultrasound of the  right upper quadrant  ____________________________________________   PROCEDURES  Procedure(s) performed: No  Procedures    ____________________________________________   INITIAL IMPRESSION / ASSESSMENT AND PLAN / ED COURSE  Pertinent labs & imaging results that were available during my care of the patient were reviewed by me and considered in my medical decision making (see chart for details).   Patient is a 35 year old male presents emergency department with concerns of upper abdominal pulling.  See HPI.  Physical exam shows patient is stable.  DDx includes pancreatitis, PUD, cholecystitis, small bowel obstruction  CBC is normal, comprehensive metabolic panel normal, urinalysis is normal, lipase is minimally elevated at 67  Ultrasound right upper quadrant does not show any acute abnormality, reviewed by me and confirmed by radiology  I did discuss the findings with the patient.  He is to follow-up with GI.  Is given Dr. Horace Porteous phone number.  Given a prescription for Protonix 40 mg daily.  He is to stop taking ibuprofen and BC powders.  Also encouraged him to stop smoking.  We talked about smoking cessation.  He was discharged in stable condition.      Kyle Rasmussen was evaluated in Emergency Department on 10/26/2020 for the symptoms described in the history of present illness. He was evaluated in the context of the global COVID-19 pandemic, which necessitated consideration that the patient might be at risk for infection with the SARS-CoV-2 virus that causes COVID-19. Institutional protocols and algorithms that pertain to the evaluation of patients at risk for COVID-19 are in a state of rapid change based on information released by regulatory bodies including the CDC and federal and state  organizations. These policies and algorithms were followed during the patient's care in the ED.    As part of my medical decision making, I reviewed the following data within the electronic MEDICAL RECORD NUMBER Nursing notes reviewed and incorporated, Labs reviewed , Old chart reviewed, Radiograph reviewed , Notes from prior ED visits and [place] Controlled Substance Database  ____________________________________________   FINAL CLINICAL IMPRESSION(S) / ED DIAGNOSES  Final diagnoses:  RUQ pain  Pain of upper abdomen      NEW MEDICATIONS STARTED DURING THIS VISIT:  Discharge Medication List as of 10/26/2020  4:26 PM    START taking these medications   Details  pantoprazole (PROTONIX) 40 MG tablet Take 1 tablet (40 mg total) by mouth daily., Starting Wed 10/26/2020, Until Thu 10/26/2021, Normal         Note:  This document was prepared using Dragon voice recognition software and may include unintentional dictation errors.    Faythe Ghee, PA-C 10/26/20 [phone]  Chesley Noon, MD 11/03/20 [phone]

## 2020-10-26 NOTE — Discharge Instructions (Signed)
Follow-up with Dr. Norma Fredrickson or you if you already have a GI doctor follow-up with them.  Take Protonix to see if this helps with the upper abdominal pain.  Return emergency department if worsening.  You will need to see GI about the rectal bleeding.  Usually bright red blood beings a hemorrhoid or polyp.  Your blood cell counts were normal so this is not a concern today.

## 2020-10-26 NOTE — ED Triage Notes (Signed)
Pt c/o upper abd pain with nausea for the past 2 weeks, pt states he has noticed some intermittent red blood with his stools. Pt is in NAD on arrival , ambulatory to triage with a steady gait

## 2021-10-02 IMAGING — US US ABDOMEN LIMITED RUQ/ASCITES
1 series · 14 of 25 positions shown · non-contrast
Comparison: Abdominal ultrasound June 21, 2014

CLINICAL DATA: Right upper quadrant pain with nausea x3 weeks

EXAM:
ULTRASOUND ABDOMEN LIMITED RIGHT UPPER QUADRANT

[Series 1: us abdomen limited ruq (liver/gb) · 14 of 51 slices shown]
[im 1/51]
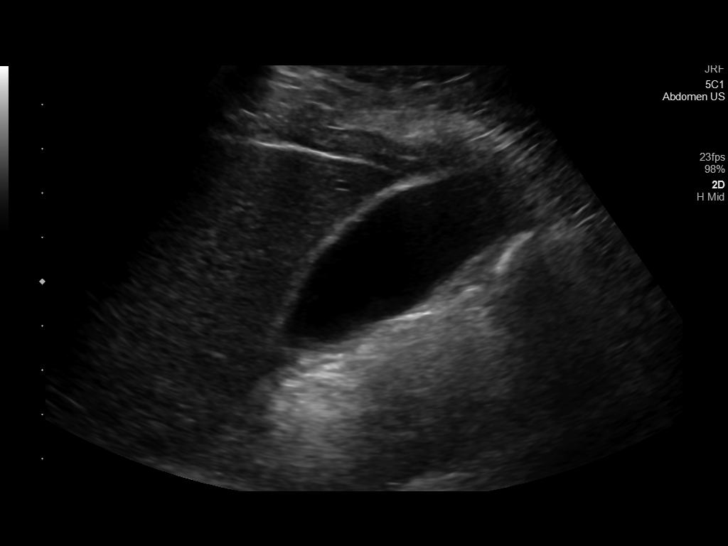
[im 5/51]
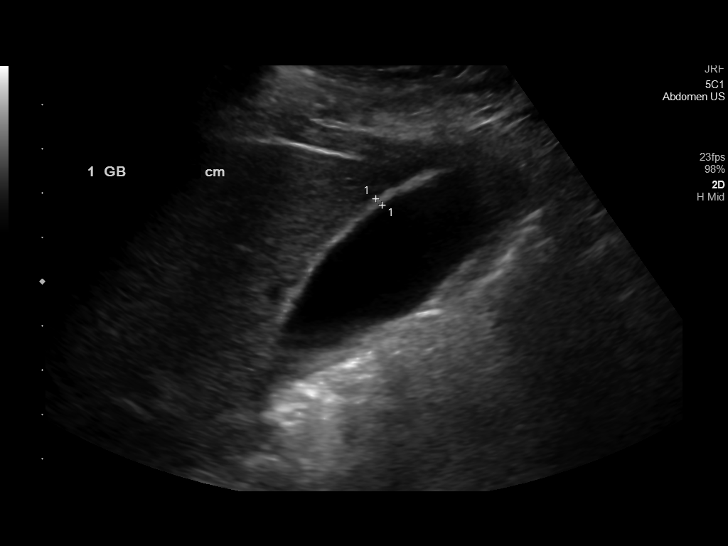
[im 9/51]
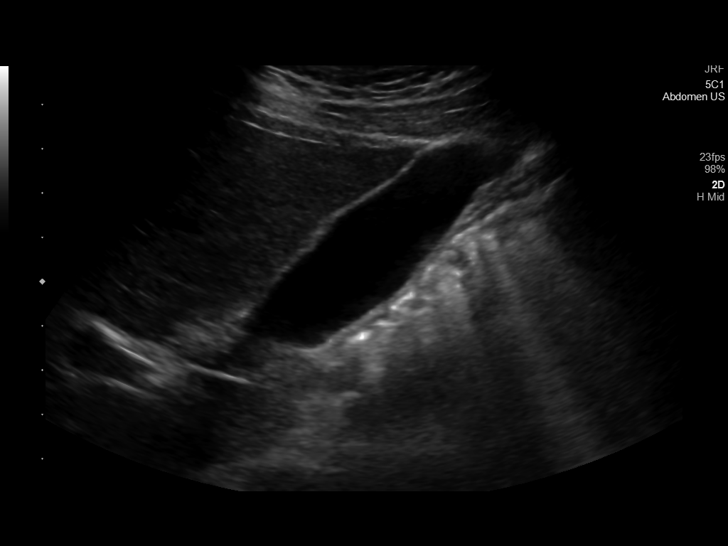
[im 13/51]
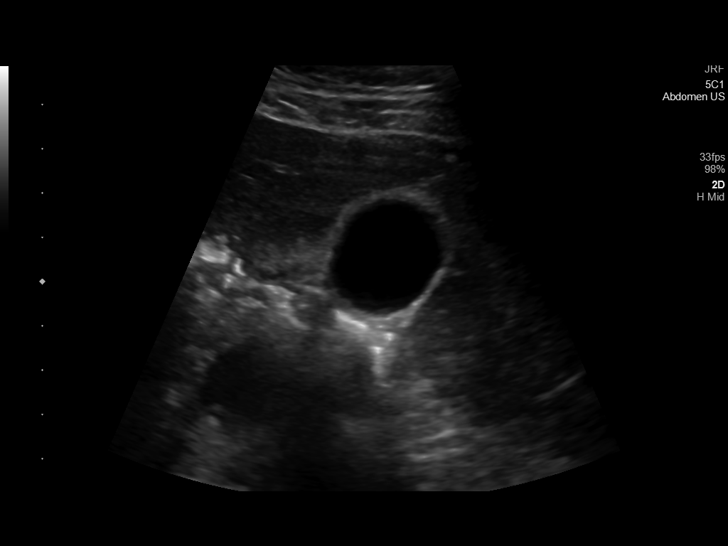
[im 17/51]
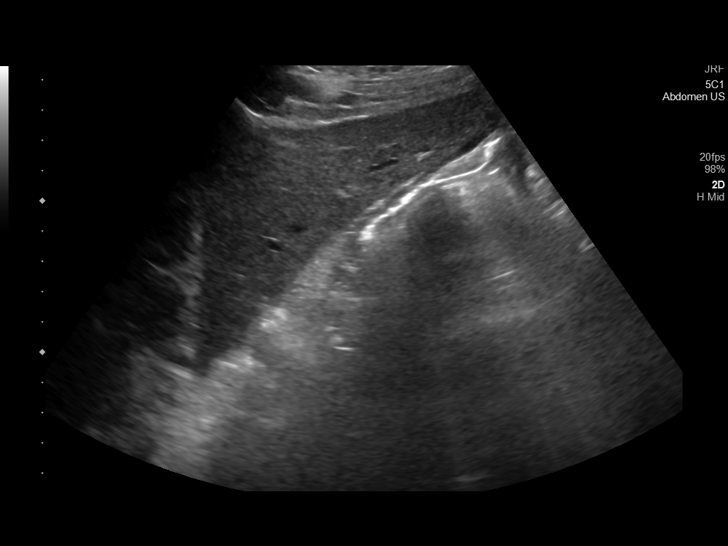
[im 19/51]
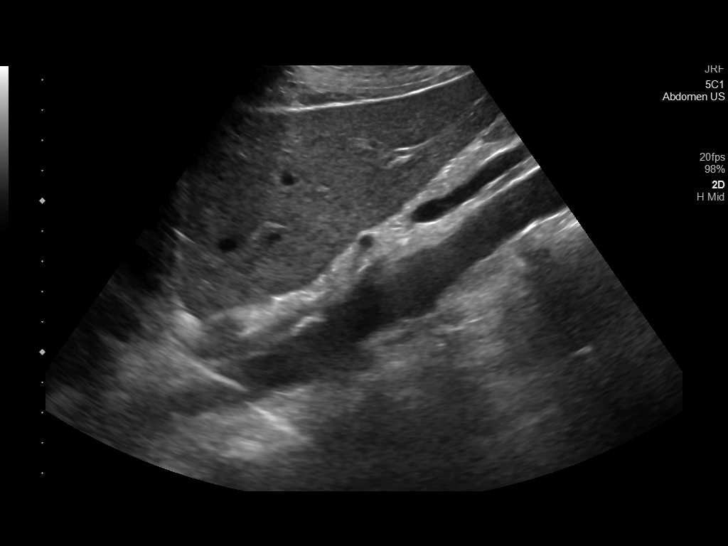
[im 23/51]
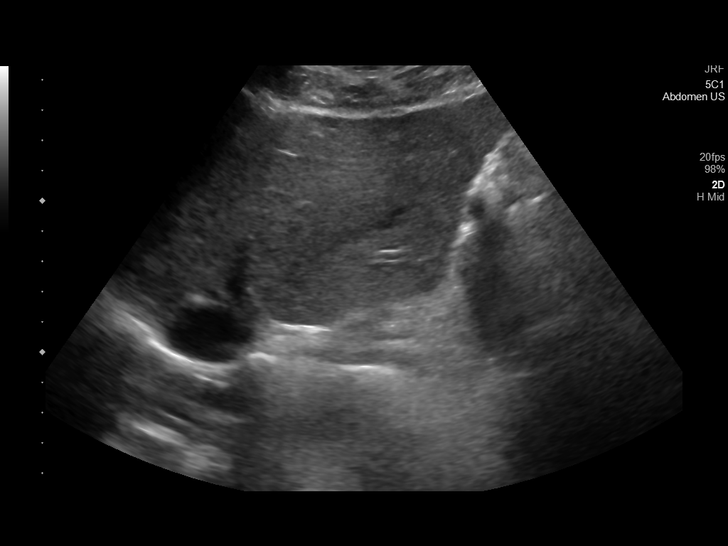
[im 28/51]
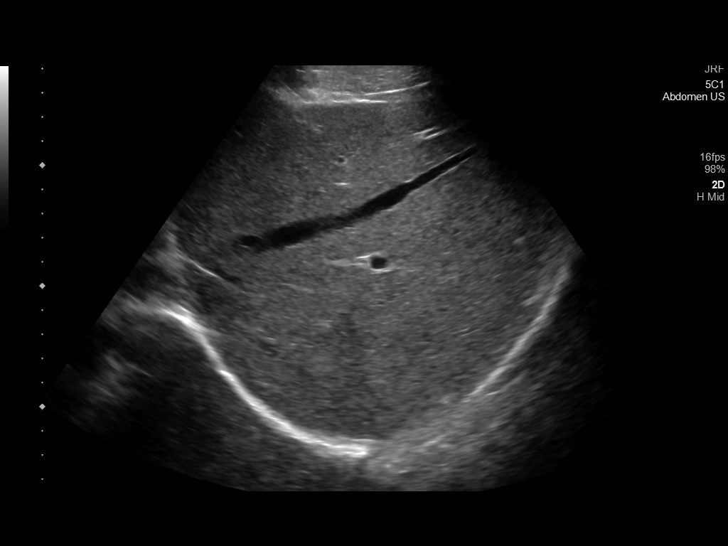
[im 32/51]
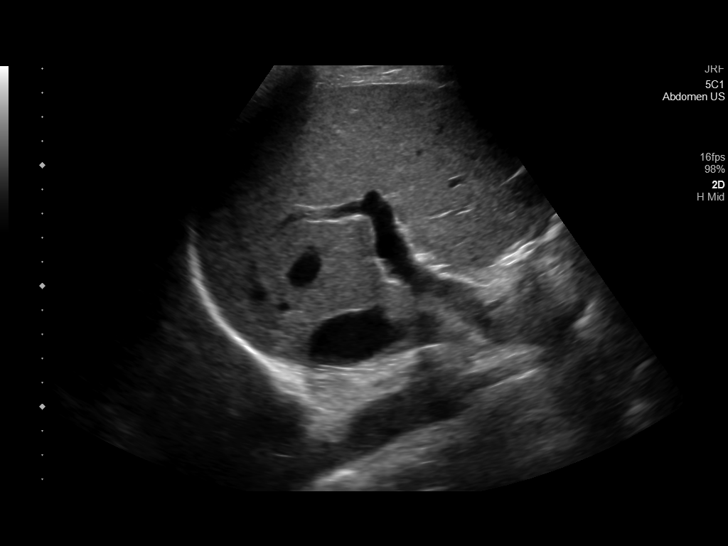
[im 34/51]
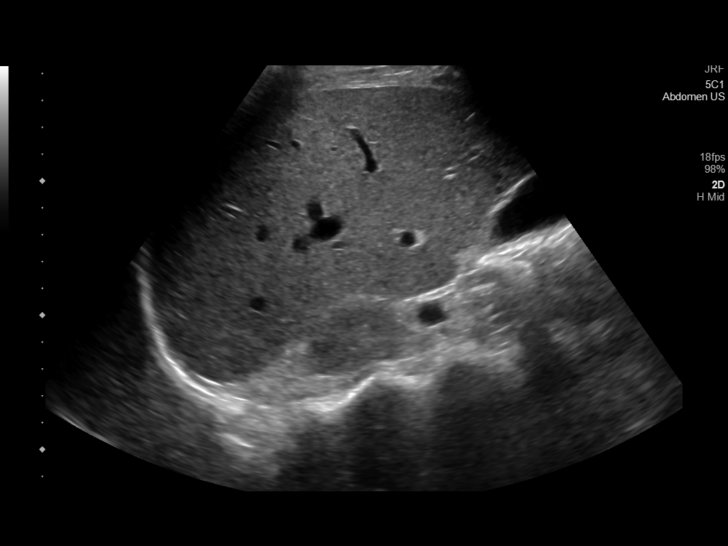
[im 38/51]
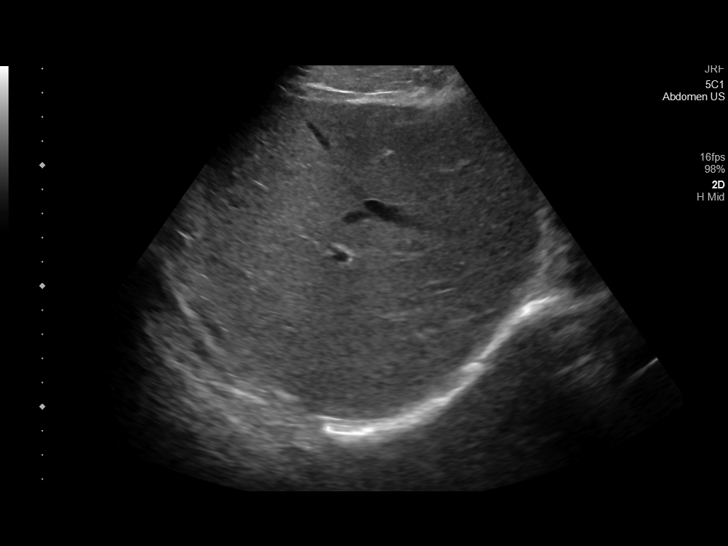
[im 42/51]
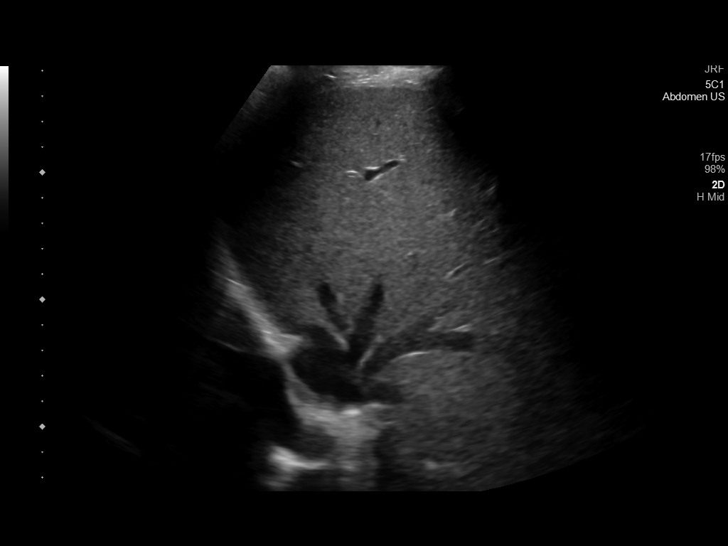
[im 46/51]
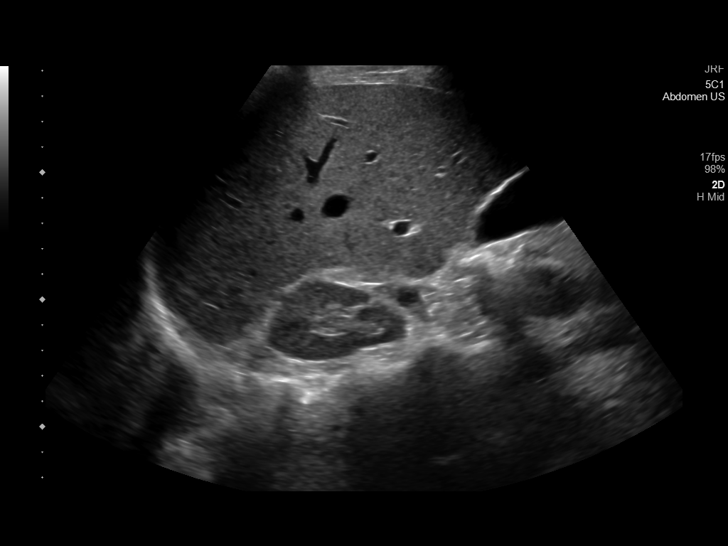
[im 51/51]
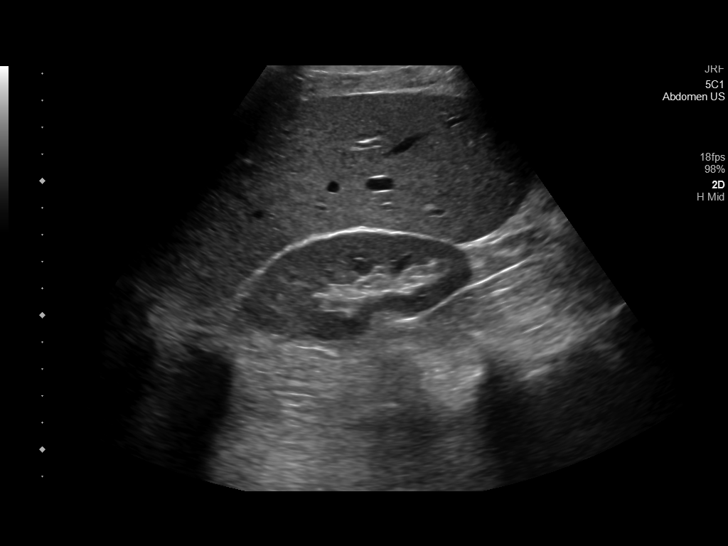

[14 of 25 positions shown; findings below may reference images not displayed]

FINDINGS: Gallbladder:

No gallstones or wall thickening visualized. No sonographic Murphy
sign noted by sonographer.

Common bile duct:

Diameter: 2 mm

Liver:

No focal lesion identified. Within normal limits in parenchymal
echogenicity. Portal vein is patent on color Doppler imaging with
normal direction of blood flow towards the liver.

Other: None.
IMPRESSION: Normal right upper quadrant ultrasound.
